# Patient Record
Sex: Male | Born: 1985 | Race: White | Marital: Single | Smoking: Former smoker
Health system: Southern US, Community
[De-identification: ages and names within clinical notes are randomized; demographics above are authoritative.]

## PROBLEM LIST (undated history)

## (undated) DIAGNOSIS — R531 Weakness: Secondary | ICD-10-CM

## (undated) DIAGNOSIS — R200 Anesthesia of skin: Secondary | ICD-10-CM

## (undated) DIAGNOSIS — G629 Polyneuropathy, unspecified: Secondary | ICD-10-CM

## (undated) DIAGNOSIS — R2689 Other abnormalities of gait and mobility: Secondary | ICD-10-CM

## (undated) DIAGNOSIS — R202 Paresthesia of skin: Secondary | ICD-10-CM

## (undated) DIAGNOSIS — I639 Cerebral infarction, unspecified: Secondary | ICD-10-CM

## (undated) DIAGNOSIS — E7800 Pure hypercholesterolemia, unspecified: Secondary | ICD-10-CM

## (undated) DIAGNOSIS — I10 Essential (primary) hypertension: Secondary | ICD-10-CM

## (undated) DIAGNOSIS — J45909 Unspecified asthma, uncomplicated: Secondary | ICD-10-CM

## (undated) DIAGNOSIS — IMO0001 Reserved for inherently not codable concepts without codable children: Secondary | ICD-10-CM

## (undated) DIAGNOSIS — R52 Pain, unspecified: Secondary | ICD-10-CM

## (undated) DIAGNOSIS — E119 Type 2 diabetes mellitus without complications: Secondary | ICD-10-CM

## (undated) DIAGNOSIS — M549 Dorsalgia, unspecified: Secondary | ICD-10-CM

## (undated) DIAGNOSIS — F329 Major depressive disorder, single episode, unspecified: Secondary | ICD-10-CM

## (undated) DIAGNOSIS — F32A Depression, unspecified: Secondary | ICD-10-CM

## (undated) DIAGNOSIS — F419 Anxiety disorder, unspecified: Secondary | ICD-10-CM

## (undated) DIAGNOSIS — R739 Hyperglycemia, unspecified: Secondary | ICD-10-CM

## (undated) DIAGNOSIS — G459 Transient cerebral ischemic attack, unspecified: Secondary | ICD-10-CM

## (undated) DIAGNOSIS — G4733 Obstructive sleep apnea (adult) (pediatric): Secondary | ICD-10-CM

## (undated) HISTORY — DX: Type 2 diabetes mellitus without complications: E11.9

## (undated) HISTORY — DX: Weakness: R53.1

## (undated) HISTORY — DX: Dorsalgia, unspecified: M54.9

## (undated) HISTORY — DX: Pain, unspecified: R52

## (undated) HISTORY — DX: Other abnormalities of gait and mobility: R26.89

## (undated) HISTORY — DX: Reserved for inherently not codable concepts without codable children: IMO0001

## (undated) HISTORY — DX: Paresthesia of skin: R20.2

## (undated) HISTORY — DX: Cerebral infarction, unspecified: I63.9

## (undated) HISTORY — DX: Pure hypercholesterolemia, unspecified: E78.00

## (undated) HISTORY — DX: Essential (primary) hypertension: I10

## (undated) HISTORY — DX: Anesthesia of skin: R20.0

## (undated) HISTORY — DX: Polyneuropathy, unspecified: G62.9

## (undated) HISTORY — DX: Unspecified asthma, uncomplicated: J45.909

## (undated) HISTORY — PX: TONSILLECTOMY: SUR1361

## (undated) HISTORY — DX: Hyperglycemia, unspecified: R73.9

## (undated) HISTORY — DX: Anxiety disorder, unspecified: F41.9

## (undated) HISTORY — PX: SPINE SURGERY: SHX786

## (undated) HISTORY — DX: Major depressive disorder, single episode, unspecified: F32.9

## (undated) HISTORY — DX: Transient cerebral ischemic attack, unspecified: G45.9

## (undated) HISTORY — DX: Depression, unspecified: F32.A

---
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
## 2010-08-15 ENCOUNTER — Telehealth (INDEPENDENT_AMBULATORY_CARE_PROVIDER_SITE_OTHER): Payer: Self-pay

## 2010-08-17 NOTE — Telephone Encounter (Signed)
Error

## 2010-12-30 ENCOUNTER — Ambulatory Visit (INDEPENDENT_AMBULATORY_CARE_PROVIDER_SITE_OTHER): Payer: BC Managed Care – PPO

## 2010-12-30 DIAGNOSIS — E669 Obesity, unspecified: Secondary | ICD-10-CM

## 2010-12-30 DIAGNOSIS — M545 Low back pain: Secondary | ICD-10-CM

## 2011-03-11 ENCOUNTER — Ambulatory Visit (INDEPENDENT_AMBULATORY_CARE_PROVIDER_SITE_OTHER): Payer: BC Managed Care – PPO | Admitting: Family Medicine

## 2011-03-11 ENCOUNTER — Telehealth: Payer: Self-pay | Admitting: Family Medicine

## 2011-03-11 DIAGNOSIS — E669 Obesity, unspecified: Secondary | ICD-10-CM

## 2011-03-11 DIAGNOSIS — J45909 Unspecified asthma, uncomplicated: Secondary | ICD-10-CM | POA: Insufficient documentation

## 2011-03-11 DIAGNOSIS — J209 Acute bronchitis, unspecified: Secondary | ICD-10-CM

## 2011-03-11 MED ORDER — PREDNISONE 20 MG PO TABS: ORAL_TABLET | ORAL | Status: DC

## 2011-03-11 MED ORDER — HYDROCODONE-HOMATROPINE 5-1.5 MG/5ML PO SYRP: 5.0000 mL | ORAL_SOLUTION | Freq: Four times a day (QID) | ORAL | Status: AC | PRN

## 2011-03-11 MED ORDER — MOXIFLOXACIN HCL 400 MG PO TABS: 400.0000 mg | ORAL_TABLET | Freq: Every day | ORAL | Status: AC

## 2011-03-11 NOTE — Telephone Encounter (Signed)
Found rx for Hydromet on printer, went to call in rx to [facility] since patient did not receive rx.  After calling in rx, was told by Amy that since MD couldn't find rx earlier, he wrote rx and handed to patient.  I called [facility] and told them to disregard rx called in.

## 2011-03-11 NOTE — Patient Instructions (Signed)
Asthma, Acute Bronchospasm     Your exam shows you have asthma, or acute bronchospasm that acts like asthma. Bronchospasm means your air passages become narrowed. These conditions are due to inflammation and airway spasm that cause narrowing of the bronchial tubes in the lungs. This causes you to have wheezing and shortness of breath.  CAUSES   Respiratory infections and allergies most often bring on these attacks. Smoking, air pollution, cold air, emotional upsets, and vigorous exercise can also bring them on.   TREATMENT   · Treatment is aimed at making the narrowed airways larger. Mild asthma/bronchospasm is usually controlled with inhaled medicines. Albuterol is a common medicine that you breathe in to open spastic or narrowed airways. Some trade names for albuterol are Ventolin or Proventil. Steroid medicine is also used to reduce the inflammation when an attack is moderate or severe. Antibiotics (medications used to kill germs) are only used if a bacterial infection is present.   · If you are pregnant and need to use Albuterol (Ventolin or Proventil), you can expect the baby to move more than usual shortly after the medicine is used.   HOME CARE INSTRUCTIONS   · Rest.   · Drink plenty of liquids. This helps the mucus to remain thin and easily coughed up. Do not use caffeine or alcohol.   · Do not smoke. Avoid being exposed to second-hand smoke.   · You play a critical role in keeping yourself in good health. Avoid exposure to things that cause you to wheeze. Avoid exposure to things that cause you to have breathing problems. Keep your medications up-to-date and available. Carefully follow your doctor's treatment plan.   · When pollen or pollution is bad, keep windows closed and use an air conditioner go to places with air conditioning. If you are allergic to furry pets or birds, find new homes for them or keep them outside.   · Take your medicine exactly as prescribed.   · Asthma requires careful medical  attention. See your caregiver for follow-up as advised. If you are more than [redacted] weeks pregnant and you were prescribed any new medications, let your Obstetrician know about the visit and how you are doing. Arrange a recheck.   SEEK IMMEDIATE MEDICAL CARE IF:   · You are getting worse.   · You have trouble breathing. If severe, call 911.   · You develop chest pain or discomfort.   · You are throwing up or not drinking fluids.   · You are not getting better within 24 hours.   · You are coughing up yellow, green, brown, or bloody sputum.   · You develop a fever over 102° F (38.9° C).   · You have trouble swallowing.   MAKE SURE YOU:   · Understand these instructions.   · Will watch your condition.   · Will get help right away if you are not doing well or get worse.   Document Released: 04/18/2006 Document Revised: 09/13/2010 Document Reviewed: 12/16/2006  ExitCare® Patient Information ©2012 [employer].

## 2011-03-11 NOTE — Progress Notes (Signed)
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  He is seen with mother.  He had been improving until this past week when he was exposed to coworkers who are sick and he got much worse with violent loud cough and gasping.  He has inhalers and nebulizers. Unable to work today He's light headed and dizzy, esp after cough  O:  Violent cough, appears tired. No retractions HEENT:  Red pharynx, normal TM's Coarse BS Heart:  Rate about 100, no murmur.  Assessment: Acute bronchitis  Plan: Hydromet, Levaquin, and prednisone  I work for 3 days

## 2011-03-14 ENCOUNTER — Ambulatory Visit (INDEPENDENT_AMBULATORY_CARE_PROVIDER_SITE_OTHER): Payer: BC Managed Care – PPO | Admitting: Family Medicine

## 2011-03-14 ENCOUNTER — Ambulatory Visit: Payer: BC Managed Care – PPO

## 2011-03-14 VITALS — BP 131/84 | HR 112 | Temp 98.2°F | Resp 18 | Ht 75.0 in | Wt >= 6400 oz

## 2011-03-14 DIAGNOSIS — R059 Cough, unspecified: Secondary | ICD-10-CM

## 2011-03-14 DIAGNOSIS — R05 Cough: Secondary | ICD-10-CM

## 2011-03-14 DIAGNOSIS — J40 Bronchitis, not specified as acute or chronic: Secondary | ICD-10-CM

## 2011-03-14 DIAGNOSIS — M794 Hypertrophy of (infrapatellar) fat pad: Secondary | ICD-10-CM

## 2011-03-14 DIAGNOSIS — J45909 Unspecified asthma, uncomplicated: Secondary | ICD-10-CM

## 2011-03-14 LAB — POCT CBC
Granulocyte percent: 84.8 %G — AB (ref 37–80)
HCT, POC: 44 % (ref 43.5–53.7)
Hemoglobin: 14.2 g/dL (ref 14.1–18.1)
Lymph, poc: 1.4 (ref 0.6–3.4)
MCH, POC: 26.9 pg — AB (ref 27–31.2)
MCHC: 32.3 g/dL (ref 31.8–35.4)
MCV: 83.5 fL (ref 80–97)
MID (cbc): 0.4 (ref 0–0.9)
MPV: 9.5 fL (ref 0–99.8)
POC Granulocyte: 9.6 — AB (ref 2–6.9)
POC LYMPH PERCENT: 12.1 %L (ref 10–50)
POC MID %: 3.1 %M (ref 0–12)
Platelet Count, POC: 283 10*3/uL (ref 142–424)
RBC: 5.27 M/uL (ref 4.69–6.13)
RDW, POC: 14.7 %
WBC: 11.3 10*3/uL — AB (ref 4.6–10.2)

## 2011-03-14 MED ORDER — ESOMEPRAZOLE MAGNESIUM 40 MG PO CPDR: 40.0000 mg | DELAYED_RELEASE_CAPSULE | Freq: Every day | ORAL | Status: DC

## 2011-03-14 MED ORDER — MOMETASONE FURO-FORMOTEROL FUM 200-5 MCG/ACT IN AERO: 2.0000 | INHALATION_SPRAY | Freq: Two times a day (BID) | RESPIRATORY_TRACT | Status: DC

## 2011-03-14 NOTE — Progress Notes (Signed)
This is 26 year old man with asthma who comes in with his mother. He's had a violent cough for about a week now which has not improved since his visit here on Sunday when he got steroids, inhaler, Levaquin. He describes his cough is spasmodic and violent. He's had some retching with it. 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  Objective: Patient is an obese adult male in no acute distress who appears disaffected.  HEENT: Unremarkable  Chest: Intermittent violent cough, extra wheezes occasionally  Heart: Regular, no murmur or gallop  Skin: Good color, warm and dry  [facility] reading (PRIMARY) by  Dr. Milus Glazier:  nad on cxr  Results for orders placed in visit on 03/14/11  POCT CBC      Component Value Range   WBC 11.3 (*) 4.6 - 10.2 (K/uL)   Lymph, poc 1.4  0.6 - 3.4    POC LYMPH PERCENT 12.1  10 - 50 (%L)   MID (cbc) 0.4  0 - 0.9    POC MID % 3.1  0 - 12 (%M)   POC Granulocyte 9.6 (*) 2 - 6.9    Granulocyte percent 84.8 (*) 37 - 80 (%G)   RBC 5.27  4.69 - 6.13 (M/uL)   Hemoglobin 14.2  14.1 - 18.1 (g/dL)   HCT, POC 78.2  95.6 - 53.7 (%)   MCV 83.5  80 - 97 (fL)   MCH, POC 26.9 (*) 27 - 31.2 (pg)   MCHC 32.3  31.8 - 35.4 (g/dL)   RDW, POC 21.3     Platelet Count, POC 283  142 - 424 (K/uL)   MPV 9.5  0 - 99.8 (fL)    A:  Severe bronchitis, unresponsive to current meds.  Also, patient has unusual affect and it's difficult to relate to him, especially with mother in room interrupting frequently and speaking for him.  His morbid obesity makes me wonder if he has some element of reflux or sleep apnea.  P:  Add Dulera and Nexium Follow up in 1 week

## 2011-03-14 NOTE — Patient Instructions (Signed)
Follow up 1 week.

## 2011-03-18 LAB — BORDETELLA PERTUSSIS ANTIBODY
B pertussis IgA Ab, Quant: 0.2 U/mL (ref <=1.1)
B pertussis IgG Ab, Quant: 2.4 U/mL (ref <=2.4)
B pertussis IgM Ab, Quant: 0.6 U/mL (ref <=1.1)

## 2011-03-19 ENCOUNTER — Telehealth: Payer: Self-pay

## 2011-03-19 NOTE — Telephone Encounter (Signed)
.  [facility] DELORUS STATES WE HAD CALLED REGARDING HER SON AND SHE IS RETURNING THE CALL PLEASE CALL [phone]

## 2011-03-20 NOTE — Telephone Encounter (Signed)
PT MOTHER ALREADY ADVISED OF LAB RESULTS THIS AM

## 2011-07-20 ENCOUNTER — Ambulatory Visit (INDEPENDENT_AMBULATORY_CARE_PROVIDER_SITE_OTHER): Payer: BC Managed Care – PPO | Admitting: Internal Medicine

## 2011-07-20 ENCOUNTER — Telehealth: Payer: Self-pay

## 2011-07-20 VITALS — BP 137/82 | HR 136 | Temp 99.1°F | Resp 17 | Ht 75.0 in | Wt >= 6400 oz

## 2011-07-20 DIAGNOSIS — Z6841 Body Mass Index (BMI) 40.0 and over, adult: Secondary | ICD-10-CM | POA: Insufficient documentation

## 2011-07-20 DIAGNOSIS — J45909 Unspecified asthma, uncomplicated: Secondary | ICD-10-CM

## 2011-07-20 DIAGNOSIS — J9801 Acute bronchospasm: Secondary | ICD-10-CM

## 2011-07-20 MED ORDER — HYDROCODONE-HOMATROPINE 5-1.5 MG/5ML PO SYRP: 5.0000 mL | ORAL_SOLUTION | Freq: Three times a day (TID) | ORAL | Status: AC | PRN

## 2011-07-20 MED ORDER — ALBUTEROL SULFATE HFA 108 (90 BASE) MCG/ACT IN AERS: 2.0000 | INHALATION_SPRAY | Freq: Four times a day (QID) | RESPIRATORY_TRACT | Status: AC | PRN

## 2011-07-20 MED ORDER — PREDNISONE 20 MG PO TABS: ORAL_TABLET | ORAL | Status: DC

## 2011-07-20 MED ORDER — ALBUTEROL SULFATE (2.5 MG/3ML) 0.083% IN NEBU: 2.5000 mg | INHALATION_SOLUTION | Freq: Four times a day (QID) | RESPIRATORY_TRACT | Status: AC | PRN

## 2011-07-20 MED ORDER — BECLOMETHASONE DIPROPIONATE 80 MCG/ACT IN AERS: 1.0000 | INHALATION_SPRAY | RESPIRATORY_TRACT | Status: DC | PRN

## 2011-07-20 NOTE — Telephone Encounter (Signed)
Pts mother Gracelyn Nurse states that the pharmacist at [facility] disagrees with the dosage that was prescribed for the qvar that was prescribed. Pt is at the pharmacy now waiting. Pt mother would like [place] to call [facility] [address] asap. [phone] Gracelyn Nurse)

## 2011-07-20 NOTE — Telephone Encounter (Signed)
Spoke with Dr. Merla Riches and he wanted the RX to read 1 puff 2 times daily. Pharmacy was called and this was corrected.

## 2011-07-20 NOTE — Progress Notes (Signed)
  Subjective:    Patient ID: Brandon Morales, male    DOB: 08/17/1985, 26 y.o.   MRN: [id number]  HPI 1. BMI 50.0-59.9, adult   2. Asthma    Out of medication and recently having problems due to significant wet weather Has done well since last treatment here until recently Asthma dates to childhood Cough keeps him awake Wheezing throughout the day for one week    Review of SystemsNo fever chills or night sweats Cough nonproductive     Objective:   Physical Exam Morbid obesity Mild wheezing on forced expiration bilaterally       Assessment & Plan:   1. BMI 50.0-59.9, adult   2. Asthma    Meds ordered this encounter  Medications  . beclomethasone (QVAR) 80 MCG/ACT inhaler    Sig: Inhale 1 puff into the lungs Twice a day    Dispense:  1 Inhaler    Refill:  11  . albuterol (PROVENTIL) (2.5 MG/3ML) 0.083% nebulizer solution    Sig: Take 3 mLs (2.5 mg total) by nebulization every 6 (six) hours as needed.    Dispense:  75 mL    Refill:  11  . albuterol (PROVENTIL HFA;VENTOLIN HFA) 108 (90 BASE) MCG/ACT inhaler    Sig: Inhale 2 puffs into the lungs every 6 (six) hours as needed.    Dispense:  1 Inhaler    Refill:  11  . predniSONE (DELTASONE) 20 MG tablet    Sig: 3/3/2/2/1/1single daily dose for 6 days    Dispense:  12 tablet    Refill:  0  . HYDROcodone-homatropine (HYCODAN) 5-1.5 MG/5ML syrup    Sig: Take 5 mLs by mouth every 8 (eight) hours as needed for cough.    Dispense:  120 mL    Refill:  0   Followup one month

## 2011-08-04 ENCOUNTER — Ambulatory Visit (INDEPENDENT_AMBULATORY_CARE_PROVIDER_SITE_OTHER): Payer: BC Managed Care – PPO | Admitting: Internal Medicine

## 2011-08-04 ENCOUNTER — Ambulatory Visit: Payer: BC Managed Care – PPO

## 2011-08-04 VITALS — BP 118/74 | HR 112 | Temp 99.1°F | Resp 18 | Ht 74.5 in | Wt >= 6400 oz

## 2011-08-04 DIAGNOSIS — E669 Obesity, unspecified: Secondary | ICD-10-CM

## 2011-08-04 DIAGNOSIS — J45909 Unspecified asthma, uncomplicated: Secondary | ICD-10-CM

## 2011-08-04 DIAGNOSIS — R5383 Other fatigue: Secondary | ICD-10-CM

## 2011-08-04 DIAGNOSIS — R0602 Shortness of breath: Secondary | ICD-10-CM

## 2011-08-04 DIAGNOSIS — J45991 Cough variant asthma: Secondary | ICD-10-CM

## 2011-08-04 DIAGNOSIS — J40 Bronchitis, not specified as acute or chronic: Secondary | ICD-10-CM

## 2011-08-04 DIAGNOSIS — R5381 Other malaise: Secondary | ICD-10-CM

## 2011-08-04 DIAGNOSIS — R05 Cough: Secondary | ICD-10-CM

## 2011-08-04 LAB — POCT CBC
HCT, POC: 44.2 % (ref 43.5–53.7)
Hemoglobin: 13.4 g/dL — AB (ref 14.1–18.1)
Lymph, poc: 2.3 (ref 0.6–3.4)
MCH, POC: 26.3 pg — AB (ref 27–31.2)
MCHC: 30.3 g/dL — AB (ref 31.8–35.4)
MCV: 86.6 fL (ref 80–97)
RBC: 5.1 M/uL (ref 4.69–6.13)
WBC: 11 10*3/uL — AB (ref 4.6–10.2)

## 2011-08-04 LAB — GLUCOSE, POCT (MANUAL RESULT ENTRY): POC Glucose: 104 mg/dL — AB (ref 70–99)

## 2011-08-04 MED ORDER — AMOXICILLIN 500 MG PO CAPS: 1000.0000 mg | ORAL_CAPSULE | Freq: Two times a day (BID) | ORAL | Status: AC

## 2011-08-04 MED ORDER — IPRATROPIUM BROMIDE 0.02 % IN SOLN
0.5000 mg | Freq: Once | RESPIRATORY_TRACT | Status: AC
  Administered 2011-08-04: 0.5 mg via RESPIRATORY_TRACT

## 2011-08-04 MED ORDER — HYDROCOD POLST-CHLORPHEN POLST 10-8 MG/5ML PO LQCR: 5.0000 mL | Freq: Two times a day (BID) | ORAL | Status: DC | PRN

## 2011-08-04 MED ORDER — ALBUTEROL SULFATE (2.5 MG/3ML) 0.083% IN NEBU
2.5000 mg | INHALATION_SOLUTION | Freq: Once | RESPIRATORY_TRACT | Status: AC
  Administered 2011-08-04: 2.5 mg via RESPIRATORY_TRACT

## 2011-08-04 NOTE — Progress Notes (Signed)
  Subjective:    Patient ID: Brandon Morales, male    DOB: 22-May-1985, 26 y.o.   MRN: [id number]  HPI Has asthma, see last visit 7/5 where restarted meds. C/o of persistant cough over 1 mo. No sputum, no fever or nite sweats. 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 Hx neg TB screen 7mos ago Used neb this am. Very obese, no hx of DM   Review of Systems     Objective:   Physical Exam 99% ox, PFR 440,nl640 HEENT neg, lungs scattered wheezes, heart nl Neb now Results for orders placed in visit on 08/04/11  POCT CBC      Component Value Range   WBC 11.0 (*) 4.6 - 10.2 K/uL   Lymph, poc 2.3  0.6 - 3.4   POC LYMPH PERCENT 21.1  10 - 50 %L   MID (cbc) 0.7  0 - 0.9   POC MID % 6.0  0 - 12 %M   POC Granulocyte 8.0 (*) 2 - 6.9   Granulocyte percent 72.9  37 - 80 %G   RBC 5.10  4.69 - 6.13 M/uL   Hemoglobin 13.4 (*) 14.1 - 18.1 g/dL   HCT, POC 40.9  81.1 - 53.7 %   MCV 86.6  80 - 97 fL   MCH, POC 26.3 (*) 27 - 31.2 pg   MCHC 30.3 (*) 31.8 - 35.4 g/dL   RDW, POC 91.4     Platelet Count, POC 255  142 - 424 K/uL   MPV 9.3  0 - 99.8 fL  GLUCOSE, POCT (MANUAL RESULT ENTRY)      Component Value Range   POC Glucose 104 (*) 70 - 99 mg/dl  POCT GLYCOSYLATED HEMOGLOBIN (HGB A1C)      Component Value Range   Hemoglobin A1C 5.3     Post PFR 500l/m    [facility] reading (PRIMARY) by  Dr.Guest cxr no infiltrate, no tb.   Assessment & Plan:  Take meds as ordered Schedule cpe with Dr. Merla Riches

## 2011-12-21 ENCOUNTER — Ambulatory Visit: Payer: BC Managed Care – PPO | Admitting: Physician Assistant

## 2011-12-21 VITALS — BP 118/90 | HR 83 | Temp 97.1°F | Resp 16 | Ht 75.0 in

## 2011-12-21 DIAGNOSIS — Z23 Encounter for immunization: Secondary | ICD-10-CM

## 2011-12-21 NOTE — Progress Notes (Signed)
Pt here for flu shot only

## 2012-01-21 ENCOUNTER — Ambulatory Visit (INDEPENDENT_AMBULATORY_CARE_PROVIDER_SITE_OTHER): Payer: BC Managed Care – PPO | Admitting: Family Medicine

## 2012-01-21 ENCOUNTER — Ambulatory Visit: Payer: BC Managed Care – PPO

## 2012-01-21 VITALS — BP 137/91 | HR 95 | Temp 97.6°F | Resp 16

## 2012-01-21 DIAGNOSIS — E669 Obesity, unspecified: Secondary | ICD-10-CM

## 2012-01-21 DIAGNOSIS — M25569 Pain in unspecified knee: Secondary | ICD-10-CM

## 2012-01-21 DIAGNOSIS — T148XXA Other injury of unspecified body region, initial encounter: Secondary | ICD-10-CM

## 2012-01-21 MED ORDER — IBUPROFEN 800 MG PO TABS: 800.0000 mg | ORAL_TABLET | Freq: Three times a day (TID) | ORAL | Status: DC | PRN

## 2012-01-21 NOTE — Progress Notes (Signed)
[facility]:  Office Visit  Chief Complaint:  Chief Complaint  Patient presents with  . Knee Pain    left knee thigh and left buttock pain after trying to get from a kneeling position to a standing position Saturday    HPI: Brandon Morales is a 27 y.o. male who complains of  Left knee pain x 3 days, started after going from crouched position where he is on his right knee and his left knee is in a 90 degree flexed position and goes to standing position. Throbbing, sharp, aching pain, worse with standing 10/10 but better with sitting 2/10 pain. Had prior left knee injury 8 years ago s/p fall. 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 OTC treatment without relief. Denies numbness, weakness, tingling.   Past Medical History  Diagnosis Date  . Asthma   . Anxiety    No past surgical history on file. History   Social History  . Marital Status: Single    Spouse Name: N/A    Number of Children: N/A  . Years of Education: N/A   Social History Main Topics  . Smoking status: Former Smoker    Quit date: 03/10/2008  . Smokeless tobacco: Not on file  . Alcohol Use: Not on file  . Drug Use: Not on file  . Sexually Active: Not on file   Other Topics Concern  . Not on file   Social History Narrative  . No narrative on file   Family History  Problem Relation Age of Onset  . Diabetes Mother   . Hypertension Mother   . Diabetes Maternal Grandmother    Allergies  Allergen Reactions  . Erythromycin   . Zithromax (Azithromycin Dihydrate)    Prior to Admission medications   Medication Sig Start Date End Date Taking? Authorizing Provider  albuterol (PROVENTIL HFA;VENTOLIN HFA) 108 (90 BASE) MCG/ACT inhaler Inhale 2 puffs into the lungs every 6 (six) hours as needed. 07/20/11  Yes Tonye Pearson, MD  albuterol (PROVENTIL) (2.5 MG/3ML) 0.083% nebulizer solution Take 3 mLs (2.5 mg  total) by nebulization every 6 (six) hours as needed. 07/20/11  Yes Tonye Pearson, MD  aspirin 500 MG EC tablet Take 500 mg by mouth every 6 (six) hours as needed.   Yes Historical Provider, MD  beclomethasone (QVAR) 80 MCG/ACT inhaler Inhale 1 puff into the lungs as needed. 07/20/11  Yes Tonye Pearson, MD  cholecalciferol (VITAMIN D) 400 UNITS TABS Take by mouth.   Yes Historical Provider, MD  esomeprazole (NEXIUM) 40 MG capsule Take 1 capsule (40 mg total) by mouth daily. 03/14/11 03/13/12 Yes Elvina Sidle, MD  zinc gluconate 50 MG tablet Take 50 mg by mouth daily.   Yes Historical Provider, MD  benzonatate (TESSALON) 100 MG capsule Take 100 mg by mouth 3 (three) times daily as needed.    Historical Provider, MD  chlorpheniramine-HYDROcodone (TUSSIONEX PENNKINETIC ER) 10-8 MG/5ML LQCR Take 5 mLs by mouth every 12 (twelve) hours as needed. 08/04/11   Jonita Albee, MD  HYDROcodone-homatropine [facility]) 5-1.5 MG/5ML syrup Take by mouth every 6 (six) hours as needed.    Historical Provider, MD  Mometasone Furo-Formoterol Fum 200-5 MCG/ACT AERO Inhale 2 puffs into the lungs 2 (two) times daily. 03/14/11   Elvina Sidle, MD  predniSONE (DELTASONE) 20 MG tablet 3/3/2/2/1/1single daily dose for 6 days 07/20/11   Tonye Pearson,  MD     ROS: The patient denies fevers, chills, night sweats, unintentional weight loss, chest pain, palpitations, wheezing, dyspnea on exertion, nausea, vomiting, abdominal pain, dysuria, hematuria, melena, numbness, weakness, or tingling.   All other systems have been reviewed and were otherwise negative with the exception of those mentioned in the HPI and as above.    PHYSICAL EXAM: Filed Vitals:   01/21/12 0833  BP: 137/91  Pulse: 95  Temp: 97.6 F (36.4 C)  Resp: 16   There were no vitals filed for this visit. There is no height or weight on file to calculate BMI.  General: Alert, no acute distress. Morbidly obese male HEENT:  Normocephalic, atraumatic,  oropharynx patent.  Cardiovascular:  Regular rate and rhythm, no rubs murmurs or gallops.  No Carotid bruits, radial pulse intact. No pedal edema.  Respiratory: Clear to auscultation bilaterally.  No wheezes, rales, or rhonchi.  No cyanosis, no use of accessory musculature GI: No organomegaly, abdomen is soft and non-tender, positive bowel sounds.  No masses. Skin: No rashes. Neurologic: Facial musculature symmetric. Psychiatric: Patient is appropriate throughout our interaction. Lymphatic: No cervical lymphadenopathy Musculoskeletal: Gait patient in wheelchair Left hip-able to internallyand externally rotate Left knee-No deformities,Full ROM, 5/5 strength,sensation intact. + tender medial and lateral joint line, medial greater than later. Also pain along LCL on valgus/varus stresss Neg Lachman's however very difficult to tell based on patient's weight   LABS:    EKG/XRAY:   Primary read interpreted by Dr. Conley Rolls at [facility]. No fractures/dilsocation + soft tissue swelling   ASSESSMENT/PLAN: Encounter Diagnoses  Name Primary?  . Knee pain Yes  . Obesity (BMI 35.0-39.9 without comorbidity)   . Sprain and strain    Crutches to help mobilize around house Rx Ibuprofen 800 mg every 8 hrs prn with food Increase activities and weightbearing status as tolerated.  Patient has knee brace at home, ROM prn, RICE F/u in 7 days, patient will get work note to be off work for 7 days since his job requires him to be standing Would like referral to [facility] for Brandon Morales, Brandon Mascio PHUONG, DO 01/21/2012 10:24 AM

## 2012-10-17 ENCOUNTER — Ambulatory Visit (INDEPENDENT_AMBULATORY_CARE_PROVIDER_SITE_OTHER): Payer: BC Managed Care – PPO | Admitting: Family Medicine

## 2012-10-17 VITALS — BP 140/80 | HR 110 | Temp 90.0°F | Resp 18 | Ht 75.0 in | Wt >= 6400 oz

## 2012-10-17 DIAGNOSIS — K1379 Other lesions of oral mucosa: Secondary | ICD-10-CM

## 2012-10-17 DIAGNOSIS — R5381 Other malaise: Secondary | ICD-10-CM

## 2012-10-17 DIAGNOSIS — E319 Polyglandular dysfunction, unspecified: Secondary | ICD-10-CM

## 2012-10-17 DIAGNOSIS — G479 Sleep disorder, unspecified: Secondary | ICD-10-CM

## 2012-10-17 MED ORDER — METHYLPREDNISOLONE SODIUM SUCC 125 MG IJ SOLR
125.0000 mg | Freq: Once | INTRAMUSCULAR | Status: AC
  Administered 2012-10-17: 125 mg via INTRAMUSCULAR

## 2012-10-17 MED ORDER — AMOXICILLIN 875 MG PO TABS: 875.0000 mg | ORAL_TABLET | Freq: Two times a day (BID) | ORAL | Status: DC

## 2012-10-17 MED ORDER — RANITIDINE HCL 150 MG PO TABS: 150.0000 mg | ORAL_TABLET | Freq: Two times a day (BID) | ORAL | Status: DC

## 2012-10-17 MED ORDER — DIPHENHYDRAMINE HCL 50 MG/ML IJ SOLN
50.0000 mg | Freq: Once | INTRAMUSCULAR | Status: AC
  Administered 2012-10-17: 50 mg via INTRAMUSCULAR

## 2012-10-17 MED ORDER — PREDNISONE 20 MG PO TABS: ORAL_TABLET | ORAL | Status: DC

## 2012-10-17 NOTE — Progress Notes (Signed)
Subjective: 27 year old male. 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 He awakened around 9 with a feeling like he couldn't breathe and was choking. He vomited several times. He felt short of breath and very anxious. He has not had quite this sensation before ever. Otherwise brief review of systems was normal. He has apparently generally been fairly healthy.  He does have a history of asthma. He has not been tested for sleep apnea. He does have daytime fatigue which he attributes to his job. He does go to sleep fairly easily also contributed to his job. He stays with his mother. He does snore. I spoke with his mother and she is concerned about these things also.  Objective: Morbidly obese white male. Alert and oriented but speaks in a very soft voice. His throat has swelling of the uvula and some erythema of the it, probably 2 or 3 times normal size but no obstruction of airway. He has ample airway around it. His neck supple without significant nodes. Chest clear. Heart regular but tachycardic.  EKG normal but mildly tachycardic  Neck 22-1/2 inches in circumference  Assessment: Uvula edema Suspicious for sleep apnea syndrome Fatigue Heart tachycardia  Plan: Ranitidine, Benadryl, Solu-Medrol. Orders sleep study  Amoxicillin  Return tomorrow for recheck of throat  Strep test  Results for orders placed in visit on 10/17/12  POCT RAPID STREP A (OFFICE)      Result Value Range   Rapid Strep A Screen Negative  Negative   Over the course of a hour and a half or so he was checked several times. His

## 2012-10-17 NOTE — Patient Instructions (Addendum)
Take prednisone 3 pills daily for 2 days, 2 daily for 2 days, one daily for 2 days. Since we gave you the injection today weight and begin them with food at 6 PM tonight, then each morning.  Amoxicillin 875 mg one pill twice daily.  Zyrtec antihistamine one pill once daily  Ranitidine 150 mg twice daily  In the event of worse swelling sensation or difficulty breathing call 911 and go to the hospital.  Sleep apnea testing will be scheduled for him.  Return tomorrow morning for a followup check

## 2012-10-18 ENCOUNTER — Ambulatory Visit (INDEPENDENT_AMBULATORY_CARE_PROVIDER_SITE_OTHER): Payer: BC Managed Care – PPO | Admitting: Family Medicine

## 2012-10-18 VITALS — BP 137/84 | HR 71 | Temp 98.1°F | Resp 18 | Wt >= 6400 oz

## 2012-10-18 DIAGNOSIS — K1370 Unspecified lesions of oral mucosa: Secondary | ICD-10-CM

## 2012-10-18 DIAGNOSIS — K1379 Other lesions of oral mucosa: Secondary | ICD-10-CM

## 2012-10-18 NOTE — Progress Notes (Signed)
Subjective: 27 year old man who is here yesterday with uvula edema. After he went home he slept yesterday he had last night. He still feels tired today, but is not having a choking sensation.  Objective: The uvula is basically back to normal in size, about a fourth or fifth of its size from yesterday.  Assessment: Uvula edema, uncertain etiology  Plan: Still will pursue the sleep study testing He can go back to work If he feels tickling in his throat he can start taking a daily antihistamine Return if worse or call 911 in an emergency at anytime

## 2012-10-18 NOTE — Patient Instructions (Signed)
Take an antihistamine if symptoms persist, such as Claritin, Allegra, or Zyrtec  Drink plenty of fluids  Return or call 911 in the event of worsening  Return to work this evening

## 2013-08-13 ENCOUNTER — Ambulatory Visit (INDEPENDENT_AMBULATORY_CARE_PROVIDER_SITE_OTHER): Payer: BC Managed Care – PPO | Admitting: Family Medicine

## 2013-08-13 VITALS — BP 122/86 | HR 79 | Temp 98.1°F | Resp 18 | Ht 75.0 in | Wt >= 6400 oz

## 2013-08-13 DIAGNOSIS — B001 Herpesviral vesicular dermatitis: Secondary | ICD-10-CM

## 2013-08-13 DIAGNOSIS — R509 Fever, unspecified: Secondary | ICD-10-CM

## 2013-08-13 DIAGNOSIS — B009 Herpesviral infection, unspecified: Secondary | ICD-10-CM

## 2013-08-13 DIAGNOSIS — IMO0001 Reserved for inherently not codable concepts without codable children: Secondary | ICD-10-CM

## 2013-08-13 DIAGNOSIS — R51 Headache: Secondary | ICD-10-CM

## 2013-08-13 DIAGNOSIS — J4541 Moderate persistent asthma with (acute) exacerbation: Secondary | ICD-10-CM

## 2013-08-13 DIAGNOSIS — R42 Dizziness and giddiness: Secondary | ICD-10-CM

## 2013-08-13 DIAGNOSIS — J45901 Unspecified asthma with (acute) exacerbation: Secondary | ICD-10-CM

## 2013-08-13 DIAGNOSIS — E860 Dehydration: Secondary | ICD-10-CM

## 2013-08-13 LAB — COMPREHENSIVE METABOLIC PANEL
ALBUMIN: 3.9 g/dL (ref 3.5–5.2)
ALT: 38 U/L (ref 0–53)
AST: 17 U/L (ref 0–37)
Alkaline Phosphatase: 50 U/L (ref 39–117)
BUN: 18 mg/dL (ref 6–23)
CALCIUM: 9 mg/dL (ref 8.4–10.5)
CHLORIDE: 104 meq/L (ref 96–112)
CO2: 24 mEq/L (ref 19–32)
Creat: 0.89 mg/dL (ref 0.50–1.35)
GLUCOSE: 105 mg/dL — AB (ref 70–99)
POTASSIUM: 4.1 meq/L (ref 3.5–5.3)
SODIUM: 137 meq/L (ref 135–145)
TOTAL PROTEIN: 7.3 g/dL (ref 6.0–8.3)
Total Bilirubin: 0.7 mg/dL (ref 0.2–1.2)

## 2013-08-13 LAB — POCT CBC
GRANULOCYTE PERCENT: 71.4 % (ref 37–80)
HCT, POC: 44.5 % (ref 43.5–53.7)
Hemoglobin: 14.8 g/dL (ref 14.1–18.1)
Lymph, poc: 2.5 (ref 0.6–3.4)
MCH, POC: 27.7 pg (ref 27–31.2)
MCHC: 33.3 g/dL (ref 31.8–35.4)
MCV: 83.4 fL (ref 80–97)
MID (CBC): 0.2 (ref 0–0.9)
MPV: 8.3 fL (ref 0–99.8)
PLATELET COUNT, POC: 219 10*3/uL (ref 142–424)
POC GRANULOCYTE: 6.9 (ref 2–6.9)
POC LYMPH %: 26.2 % (ref 10–50)
POC MID %: 2.4 %M (ref 0–12)
RBC: 5.33 M/uL (ref 4.69–6.13)
RDW, POC: 14.8 %
WBC: 9.6 10*3/uL (ref 4.6–10.2)

## 2013-08-13 LAB — POCT UA - MICROSCOPIC ONLY
Bacteria, U Microscopic: NEGATIVE
CRYSTALS, UR, HPF, POC: NEGATIVE
Casts, Ur, LPF, POC: NEGATIVE
MUCUS UA: NEGATIVE
YEAST UA: NEGATIVE

## 2013-08-13 LAB — POCT SEDIMENTATION RATE: POCT SED RATE: 51 mm/hr — AB (ref 0–22)

## 2013-08-13 LAB — POCT URINALYSIS DIPSTICK
Bilirubin, UA: NEGATIVE
Glucose, UA: NEGATIVE
KETONES UA: NEGATIVE
Leukocytes, UA: NEGATIVE
Nitrite, UA: NEGATIVE
PH UA: 5.5
PROTEIN UA: NEGATIVE
RBC UA: NEGATIVE
SPEC GRAV UA: 1.015
UROBILINOGEN UA: 0.2

## 2013-08-13 LAB — HIV ANTIBODY (ROUTINE TESTING W REFLEX): HIV 1&2 Ab, 4th Generation: NONREACTIVE

## 2013-08-13 MED ORDER — VALACYCLOVIR HCL 1 G PO TABS: 2000.0000 mg | ORAL_TABLET | Freq: Two times a day (BID) | ORAL | Status: DC

## 2013-08-13 NOTE — Patient Instructions (Addendum)
Dehydration, Adult Dehydration is when you lose more fluids from the body than you take in. Vital organs like the kidneys, brain, and heart cannot function without a proper amount of fluids and salt. Any loss of fluids from the body can cause dehydration.  CAUSES   Vomiting.  Diarrhea.  Excessive sweating.  Excessive urine output.  Fever. SYMPTOMS  Mild dehydration  Thirst.  Dry lips.  Slightly dry mouth. Moderate dehydration  Very dry mouth.  Sunken eyes.  Skin does not bounce back quickly when lightly pinched and released.  Dark urine and decreased urine production.  Decreased tear production.  Headache. Severe dehydration  Very dry mouth.  Extreme thirst.  Rapid, weak pulse (more than 100 beats per minute at rest).  Cold hands and feet.  Not able to sweat in spite of heat and temperature.  Rapid breathing.  Blue lips.  Confusion and lethargy.  Difficulty being awakened.  Minimal urine production.  No tears. DIAGNOSIS  Your caregiver will diagnose dehydration based on your symptoms and your exam. Blood and urine tests will help confirm the diagnosis. The diagnostic evaluation should also identify the cause of dehydration. TREATMENT  Treatment of mild or moderate dehydration can often be done at home by increasing the amount of fluids that you drink. It is best to drink small amounts of fluid more often. Drinking too much at one time can make vomiting worse. Refer to the home care instructions below. Severe dehydration needs to be treated at the hospital where you will probably be given intravenous (IV) fluids that contain water and electrolytes. HOME CARE INSTRUCTIONS   Ask your caregiver about specific rehydration instructions.  Drink enough fluids to keep your urine clear or pale yellow.  Drink small amounts frequently if you have nausea and vomiting.  Eat as you normally do.  Avoid:  Foods or drinks high in sugar.  Carbonated  drinks.  Juice.  Extremely hot or cold fluids.  Drinks with caffeine.  Fatty, greasy foods.  Alcohol.  Tobacco.  Overeating.  Gelatin desserts.  Wash your hands well to avoid spreading bacteria and viruses.  Only take over-the-counter or prescription medicines for pain, discomfort, or fever as directed by your caregiver.  Ask your caregiver if you should continue all prescribed and over-the-counter medicines.  Keep all follow-up appointments with your caregiver. SEEK MEDICAL CARE IF:  You have abdominal pain and it increases or stays in one area (localizes).  You have a rash, stiff neck, or severe headache.  You are irritable, sleepy, or difficult to awaken.  You are weak, dizzy, or extremely thirsty. SEEK IMMEDIATE MEDICAL CARE IF:   You are unable to keep fluids down or you get worse despite treatment.  You have frequent episodes of vomiting or diarrhea.  You have blood or green matter (bile) in your vomit.  You have blood in your stool or your stool looks black and tarry.  You have not urinated in 6 to 8 hours, or you have only urinated a small amount of very dark urine.  You have a fever.  You faint. MAKE SURE YOU:   Understand these instructions.  Will watch your condition.  Will get help right away if you are not doing well or get worse. Document Released: 01/01/2005 Document Revised: 03/26/2011 Document Reviewed: 08/21/2010 ExitCare Patient Information 2015 [employer]. This information is not intended to replace advice given to you by your health care provider. Make sure you discuss any questions you have with your health care   provider.  

## 2013-08-13 NOTE — Progress Notes (Signed)
Subjective:  This chart was scribed for Norberto Sorenson, MD by Carl Best, Medical Scribe. This patient was seen in Room 14 and the patient's care was started at 9:36 AM.   Patient ID: Brandon Morales, male    DOB: 22-Dec-1985, 28 y.o.   MRN: [id number] Chief Complaint  Patient presents with  . Mouth Lesions    1 week  . Headache  . Generalized Body Aches  . Nausea     HPI HPI Comments: Brandon Morales is a 28 y.o. male who presents to the [facility] complaining of constant dizziness and back pain that started nine days ago.  The patient states that his symptoms started with severe chapped lips.  He states that one of his cold sores opened up and infected his bottom lip.  He also experienced abdominal pain, vomiting, and diarrhea that started before his current symptoms.  He is no longer vomiting but will still experience intermittent diarrhea.  He states that he feels like the room is spinning when he gets dizzy and will experience relief to his symptoms when he closes his eyes or sleeps.  He states that he has not been able to drink normally for a week but will occasionally sip on spring water.  He ate burgers last night.  He endorses subjective intermittent fever, diaphoresis, myalgias, neck pain, and chills as associated symptoms.  He has been complaining of a severe headache that started at the front his head and extending to the back.  He denies severe sore throat, genital sores, abnormal urine, rhinorrhea and sinus pressure as associated symptoms.  He last urinated and had a bowel movement this morning and states that both looked normal.  He endorses sick contacts.  He states that his symptoms are so severe that he is unable to drive.  He has a history of tonsillectomy.  He works in a prison.    Past Medical History  Diagnosis Date  . Asthma   . Anxiety    History reviewed. No pertinent past surgical history. Family History  Problem Relation Age of Onset  .  Diabetes Mother   . Hypertension Mother   . Diabetes Maternal Grandmother    History   Social History  . Marital Status: Single    Spouse Name: N/A    Number of Children: N/A  . Years of Education: N/A   Occupational History  . Not on file.   Social History Main Topics  . Smoking status: Former Smoker    Quit date: 03/10/2008  . Smokeless tobacco: Not on file  . Alcohol Use: Not on file  . Drug Use: Not on file  . Sexual Activity: Not on file   Other Topics Concern  . Not on file   Social History Narrative  . No narrative on file   Allergies  Allergen Reactions  . Erythromycin   . Zithromax [Azithromycin Dihydrate]     Review of Systems  Constitutional: Positive for fever, chills, diaphoresis and appetite change.  HENT: Positive for mouth sores. Negative for ear pain, rhinorrhea and sore throat.   Musculoskeletal: Positive for myalgias.  Neurological: Positive for headaches.    BP 122/86  Pulse 79  Temp(Src) 98.1 F (36.7 C)  Resp 18  Ht 6\' 3"  (1.905 m)  Wt 438 lb (198.675 kg)  BMI 54.75 kg/m2  SpO2 97% Objective:  Physical Exam  Nursing note and vitals reviewed. Constitutional: He is oriented to person, place, and time. He appears well-developed and  well-nourished.  HENT:  Head: Normocephalic and atraumatic.  Right Ear: Tympanic membrane normal.  Left Ear: Tympanic membrane normal.  Nose: Nose normal.  Mouth/Throat: Uvula is midline. Mucous membranes are dry. Posterior oropharyngeal erythema (mild) present. No oropharyngeal exudate.  Blister with small amount of pustulant vesicle on upper and lower lip on the side with dry mucous membranes.   Eyes: EOM are normal.  Neck: Normal range of motion.  Cardiovascular: Regular rhythm and normal heart sounds.  Tachycardia present.  Exam reveals no gallop and no friction rub.   No murmur heard. Pulmonary/Chest: Effort normal and breath sounds normal. No respiratory distress. He has no wheezes. He has no rales.   Abdominal: There is no CVA tenderness.  Musculoskeletal: Normal range of motion.  Lymphadenopathy:       Head (left side): Submandibular adenopathy present.  Lymphadenopathy difficult to appreciate.   Neurological: He is alert and oriented to person, place, and time.  Skin: Skin is warm and dry.  Psychiatric: He has a normal mood and affect. His behavior is normal.   Positive orthostatics.   Results for orders placed in visit on 08/13/13  POCT CBC      Result Value Ref Range   WBC 9.6  4.6 - 10.2 K/uL   Lymph, poc 2.5  0.6 - 3.4   POC LYMPH PERCENT 26.2  10 - 50 %L   MID (cbc) 0.2  0 - 0.9   POC MID % 2.4  0 - 12 %M   POC Granulocyte 6.9  2 - 6.9   Granulocyte percent 71.4  37 - 80 %G   RBC 5.33  4.69 - 6.13 M/uL   Hemoglobin 14.8  14.1 - 18.1 g/dL   HCT, POC 16.144.5  09.643.5 - 53.7 %   MCV 83.4  80 - 97 fL   MCH, POC 27.7  27 - 31.2 pg   MCHC 33.3  31.8 - 35.4 g/dL   RDW, POC 04.514.8     Platelet Count, POC 219  142 - 424 K/uL   MPV 8.3  0 - 99.8 fL   Patient reassessed after 2 L of fluid.  Headache decreased and dizziness resolved.  Cardiac, abdominal, and respiratory exam benign.  Still with positive orthostatics and has not urinated so will give another liter of fluid.    Assessment & Plan:   Will start patient on IV fluids, administer nausea medication, collect orthostatics, and complete blood work and UA in office.  Reassured patient and his mother of a viral illness, importance of pushing of fluids, avoiding of dehydration reinforced.  Out of work with symptomatic care.  Return to clinic if symptoms recur or worsen. Asthma, moderate persistent, with acute exacerbation  Fever, unspecified - Plan: POCT CBC, POCT SEDIMENTATION RATE, POCT urinalysis dipstick, POCT UA - Microscopic Only, Comprehensive metabolic panel, HIV antibody  Dehydration  Vertigo  Herpes labialis  Myalgia and myositis  Headache(784.0)  Meds ordered this encounter  Medications  . valACYclovir  (VALTREX) 1000 MG tablet    Sig: Take 2 tablets (2,000 mg total) by mouth 2 (two) times daily.    Dispense:  4 tablet    Refill:  2    I personally performed the services described in this documentation, which was scribed in my presence. The recorded information has been reviewed and considered, and addended by me as needed.  Norberto SorensonEva Shaw, MD MPH

## 2013-12-02 ENCOUNTER — Encounter: Payer: Self-pay | Admitting: Family Medicine

## 2014-11-14 ENCOUNTER — Ambulatory Visit (INDEPENDENT_AMBULATORY_CARE_PROVIDER_SITE_OTHER): Payer: BC Managed Care – PPO | Admitting: Internal Medicine

## 2014-11-14 ENCOUNTER — Ambulatory Visit (INDEPENDENT_AMBULATORY_CARE_PROVIDER_SITE_OTHER): Payer: BC Managed Care – PPO

## 2014-11-14 VITALS — BP 128/88 | HR 95 | Temp 97.5°F | Resp 18 | Ht 75.75 in | Wt >= 6400 oz

## 2014-11-14 DIAGNOSIS — M25531 Pain in right wrist: Secondary | ICD-10-CM

## 2014-11-14 DIAGNOSIS — M25521 Pain in right elbow: Secondary | ICD-10-CM | POA: Diagnosis not present

## 2014-11-14 DIAGNOSIS — R202 Paresthesia of skin: Secondary | ICD-10-CM

## 2014-11-14 MED ORDER — MELOXICAM 15 MG PO TABS: 15.0000 mg | ORAL_TABLET | Freq: Every day | ORAL | Status: DC

## 2014-11-14 NOTE — Progress Notes (Signed)
Subjective:  This chart was scribed for [name], MD by Andrew Au, ED Scribe. This patient was seen in room 3 and the patient's care was started at 3:48 PM.   Patient ID: Brandon Morales, male    DOB: 1985-03-12, 29 y.o.   MRN: [id number]  HPI Chief Complaint  Patient presents with  . Arm Pain    Right arm, starts from shoulder and spreads to finger tips. x3 days.    HPI Comments: Brandon Morales is a 29 y.o. male who presents to the [facility] complaining of right wrist pain and swelling. Pt has been waking up with excruciating right wrist pain for the past 3 days. States pain shoots up right arm to right shoulder with certain movement such as spreading his fingers and making a fist as well as occasional tingling and numbness in right fingers.  He has been playing computer games for 10+ years and has spent long hrs at this recent weeks.   Past Medical History  Diagnosis Date  . Asthma   . Anxiety     -  Psych  Prior to Admission medications   Medication Sig Start Date End Date Taking? Authorizing Provider  albuterol (PROVENTIL HFA;VENTOLIN HFA) 108 (90 BASE) MCG/ACT inhaler Inhale 2 puffs into the lungs every 6 (six) hours as needed. 07/20/11  Yes Tonye Pearson, MD  divalproex (DEPAKOTE ER) 500 MG 24 hr tablet Take 500 mg by mouth daily.   Yes Historical Provider, MD  PARoxetine (PAXIL) 20 MG tablet Take 20 mg by mouth daily.   Yes Historical Provider, MD  albuterol (PROVENTIL) (2.5 MG/3ML) 0.083% nebulizer solution Take 3 mLs (2.5 mg total) by nebulization every 6 (six) hours as needed. Patient not taking: Reported on 11/14/2014 07/20/11   Tonye Pearson, MD  beclomethasone (QVAR) 80 MCG/ACT inhaler Inhale 1 puff into the lungs as needed. Patient not taking: Reported on 11/14/2014 07/20/11   Tonye Pearson, MD  ibuprofen (ADVIL,MOTRIN) 800 MG tablet Take 1 tablet (800 mg total) by mouth every 8 (eight) hours as needed for pain. Patient  not taking: Reported on 11/14/2014 01/21/12   Thao P Le, DO  Mometasone Furo-Formoterol Fum 200-5 MCG/ACT AERO Inhale 2 puffs into the lungs 2 (two) times daily. Patient not taking: Reported on 11/14/2014 03/14/11   Elvina Sidle, MD  valACYclovir (VALTREX) 1000 MG tablet Take 2 tablets (2,000 mg total) by mouth 2 (two) times daily. Patient not taking: Reported on 11/14/2014 08/13/13   Sherren Mocha, MD   Review of Systems  Musculoskeletal: Positive for arthralgias. Negative for neck pain and neck stiffness.  Skin: Negative for color change, rash and wound.  Neurological: Positive for numbness. Negative for weakness.   Objective:   Physical Exam  Constitutional: He is oriented to person, place, and time. He appears well-developed and well-nourished. No distress.  HENT:  Head: Normocephalic and atraumatic.  Eyes: Conjunctivae and EOM are normal.  Neck: Neck supple.  Neck full ROM  Cardiovascular: Normal rate.   Pulmonary/Chest: Effort normal.  Musculoskeletal: Normal range of motion.  Minimal swelling. No redness  Joints of right arm. Marked TTP over wrist and lateral epicondyle. Bad pain with any ROM of wrist and elbow. Volar wrist tightness. Tender lateral epicondyle. Cannot make a fist due to pain. No sensory loss in fingers.   Neurological: He is alert and oriented to person, place, and time.  Skin: Skin is warm and dry.  Psychiatric: He has a normal mood  and affect. His behavior is normal.  Nursing note and vitals reviewed.    Filed Vitals:   11/14/14 1506  BP: 128/88  Pulse: 95  Temp: 97.5 F (36.4 C)  TempSrc: Oral  Resp: 18  Height: 6' 3.75" (1.924 m)  Weight: 443 lb 9.6 oz (201.216 kg)  SpO2: 97%    [facility] reading (PRIMARY) by Dr. Merla Richesoolittle. Right wrist- normal wrist.  Assessment & Plan:   1. Pain, wrist joint, right   2. Pain, elbow joint, right   3. Right hand paresthesia   4.      Morbid obesity 5.      Psych meds  Thumb spica splint --has overuse involving  wrist and lat epicond rom bid OOW(detention officer) Recheck 1-2 weeks May need PT and/or ortho referral  Meds ordered this encounter  Medications  . meloxicam (MOBIC) 15 MG tablet    Sig: Take 1 tablet (15 mg total) by mouth daily.    Dispense:  30 tablet    Refill:  0    By signing my name below, I, Raven Small, attest that this documentation has been prepared under the direction and in the presence of Ellamae Siaobert Miraj Truss, MD.  Electronically Signed: Andrew Auaven Small, ED Scribe. 11/14/2014. 4:21 PM. I have completed the patient encounter in its entirety as documented by the scribe, with editing by me where necessary. Agnieszka Newhouse P. Merla Richesoolittle, M.D.

## 2014-12-25 ENCOUNTER — Ambulatory Visit (INDEPENDENT_AMBULATORY_CARE_PROVIDER_SITE_OTHER): Payer: BC Managed Care – PPO | Admitting: Internal Medicine

## 2014-12-25 ENCOUNTER — Ambulatory Visit (INDEPENDENT_AMBULATORY_CARE_PROVIDER_SITE_OTHER): Payer: BC Managed Care – PPO

## 2014-12-25 VITALS — BP 130/90 | HR 56 | Temp 98.3°F | Resp 16 | Ht 78.0 in | Wt >= 6400 oz

## 2014-12-25 DIAGNOSIS — M791 Myalgia, unspecified site: Secondary | ICD-10-CM

## 2014-12-25 DIAGNOSIS — R6883 Chills (without fever): Secondary | ICD-10-CM | POA: Diagnosis not present

## 2014-12-25 DIAGNOSIS — R05 Cough: Secondary | ICD-10-CM

## 2014-12-25 DIAGNOSIS — R059 Cough, unspecified: Secondary | ICD-10-CM

## 2014-12-25 MED ORDER — HYDROCODONE-HOMATROPINE 5-1.5 MG/5ML PO SYRP: 5.0000 mL | ORAL_SOLUTION | Freq: Four times a day (QID) | ORAL | Status: DC | PRN

## 2014-12-25 MED ORDER — AMOXICILLIN-POT CLAVULANATE 875-125 MG PO TABS: 1.0000 | ORAL_TABLET | Freq: Two times a day (BID) | ORAL | Status: DC

## 2014-12-25 NOTE — Progress Notes (Signed)
Subjective:  This chart was scribed for Brandon Sia, MD by Stann Ore, Medical Scribe. This patient was seen in Room 5 and the patient's care was started at 2:48 PM.    Patient ID: Brandon Morales, male    DOB: [birth year], 29 y.o.   MRN: [id number] Chief Complaint  Patient presents with  . Cough    x 1 day  . URI    HPI Brandon Morales is a 29 y.o. male who presents to [facility] complaining of gradual onset URI symptoms including cough that started this morning. He describes that it feels like "being hit by a truck." He has nausea, intermittent fever, and chills. 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 He has history of chronic bronchitis throughout his life. He informs that he received a flu shot this year.   He has an inhaler for his asthma; although, he says that he hasn't had the need to use it.   Patient Active Problem List   Diagnosis Date Noted  . BMI 50.0-59.9, adult (HCC) 07/20/2011  . Asthma 03/11/2011  . Obesity 03/11/2011    Current outpatient prescriptions:  .  albuterol (PROVENTIL) (2.5 MG/3ML) 0.083% nebulizer solution, Take 3 mLs (2.5 mg total) by nebulization every 6 (six) hours as needed., Disp: 75 mL, Rfl: 11 .  beclomethasone (QVAR) 80 MCG/ACT inhaler, Inhale 1 puff into the lungs as needed., Disp: 1 Inhaler, Rfl: 11 .  divalproex (DEPAKOTE ER) 500 MG 24 hr tablet, Take 500 mg by mouth daily., Disp: , Rfl:  .  PARoxetine (PAXIL) 20 MG tablet, Take 20 mg by mouth daily., Disp: , Rfl:  .  albuterol (PROVENTIL HFA;VENTOLIN HFA) 108 (90 BASE) MCG/ACT inhaler, Inhale 2 puffs into the lungs every 6 (six) hours as needed., Disp: 1 Inhaler, Rfl: 11 .  Mometasone Furo-Formoterol Fum 200-5 MCG/ACT AERO, Inhale 2 puffs into the lungs 2 (two) times daily. (Patient not taking: Reported on 11/14/2014), Disp: 1 Inhaler, Rfl: 3 .  valACYclovir (VALTREX) 1000 MG tablet, Take 2 tablets (2,000 mg total) by mouth 2 (two) times daily. (Patient not taking: Reported on  11/14/2014), Disp: 4 tablet, Rfl: 2    Review of Systems  Constitutional: Positive for fever, chills and fatigue.  HENT: Positive for congestion. Negative for rhinorrhea, sinus pressure and sore throat.   Respiratory: Positive for cough and chest tightness.   Gastrointestinal: Positive for nausea. Negative for vomiting, diarrhea and constipation.  Neurological: Negative for dizziness.       Objective:   Physical Exam  Constitutional: He is oriented to person, place, and time. He appears well-developed and well-nourished. No distress.  Morbidly obese  HENT:  Head: Normocephalic and atraumatic.  Right Ear: External ear normal.  Left Ear: External ear normal.  Nose: Nose normal.  Mouth/Throat: Oropharynx is clear and moist.  Eyes: Conjunctivae and EOM are normal. Pupils are equal, round, and reactive to light.  Neck: Neck supple.  Cardiovascular: Normal rate, regular rhythm and normal heart sounds.   Pulmonary/Chest: Effort normal. No respiratory distress.  Generalized rhonchi with mild wheezing on forced expiration  Musculoskeletal: Normal range of motion.  Lymphadenopathy:    He has no cervical adenopathy.  Neurological: He is alert and oriented to person, place, and time.  Skin: Skin is warm and dry.  Psychiatric: He has a normal mood and affect. His behavior is normal.  Nursing note and vitals reviewed.   BP 130/90 mmHg  Pulse 56  Temp(Src) 98.3 F (36.8 C) (Oral)  Resp  16  Ht 6\' 6"  (1.981 m)  Wt 454 lb (205.933 kg)  BMI 52.48 kg/m2  SpO2 98%   [facility] reading (PRIMARY) by Dr. Merla Richesoolittle : chest xray: ? Right middle lobe infiltrate versus under penetration (rad says ok)     Assessment & Plan:  Cough - Plan: DG Chest 2 View  Chills - Plan: DG Chest 2 View  Myalgia  Suspect acute exacerbation of chr bronchitis Meds ordered this encounter  Medications  . HYDROcodone-homatropine (HYCODAN) 5-1.5 MG/5ML syrup    Sig: Take 5 mLs by mouth every 6 (six) hours as  needed.    Dispense:  120 mL    Refill:  0  . amoxicillin-clavulanate (AUGMENTIN) 875-125 MG tablet    Sig: Take 1 tablet by mouth 2 (two) times daily.    Dispense:  20 tablet    Refill:  0   Had flyu shot but could be acute influ diff str oow 2-3 d Fu 3d if not resp sooner if worse Obesity continues to be major issue that he doesn't recognize or want to alter!!!!!!!!!   By signing my name below, I, Stann Oresung-Kai Tsai, attest that this documentation has been prepared under the direction and in the presence of Brandon Siaobert Niam Nepomuceno, MD. Electronically Signed: Stann Oresung-Kai Tsai, Scribe. 12/25/2014 , 2:53 PM . I have completed the patient encounter in its entirety as documented by the scribe, with editing by me where necessary. Kimberleigh Mehan P. Merla Richesoolittle, M.D.

## 2015-05-17 ENCOUNTER — Ambulatory Visit: Payer: BC Managed Care – PPO | Admitting: Dietician

## 2015-06-07 ENCOUNTER — Encounter (HOSPITAL_COMMUNITY): Payer: Self-pay | Admitting: *Deleted

## 2015-06-07 ENCOUNTER — Emergency Department (HOSPITAL_COMMUNITY)
Admission: EM | Admit: 2015-06-07 | Discharge: 2015-06-07 | Disposition: A | Discharge status: Home / Self Care | Payer: BC Managed Care – PPO | Attending: Emergency Medicine | Admitting: Emergency Medicine

## 2015-06-07 ENCOUNTER — Emergency Department (HOSPITAL_COMMUNITY): Payer: BC Managed Care – PPO

## 2015-06-07 DIAGNOSIS — J45901 Unspecified asthma with (acute) exacerbation: Secondary | ICD-10-CM | POA: Insufficient documentation

## 2015-06-07 DIAGNOSIS — F4310 Post-traumatic stress disorder, unspecified: Secondary | ICD-10-CM | POA: Diagnosis not present

## 2015-06-07 DIAGNOSIS — Z87891 Personal history of nicotine dependence: Secondary | ICD-10-CM | POA: Insufficient documentation

## 2015-06-07 DIAGNOSIS — F419 Anxiety disorder, unspecified: Secondary | ICD-10-CM | POA: Insufficient documentation

## 2015-06-07 DIAGNOSIS — Z792 Long term (current) use of antibiotics: Secondary | ICD-10-CM | POA: Diagnosis not present

## 2015-06-07 DIAGNOSIS — Z79899 Other long term (current) drug therapy: Secondary | ICD-10-CM | POA: Diagnosis not present

## 2015-06-07 DIAGNOSIS — R0602 Shortness of breath: Secondary | ICD-10-CM | POA: Diagnosis present

## 2015-06-07 LAB — I-STAT VENOUS BLOOD GAS, ED
Bicarbonate: 24.3 mEq/L — ABNORMAL HIGH (ref 20.0–24.0)
O2 Saturation: 96 %
PCO2 VEN: 38.9 mmHg — AB (ref 45.0–50.0)
PH VEN: 7.403 — AB (ref 7.250–7.300)
PO2 VEN: 84 mmHg — AB (ref 31.0–45.0)
TCO2: 25 mmol/L (ref 0–100)

## 2015-06-07 LAB — D-DIMER, QUANTITATIVE (NOT AT ARMC)

## 2015-06-07 LAB — CBC
HEMATOCRIT: 38.9 % — AB (ref 39.0–52.0)
Hemoglobin: 12.7 g/dL — ABNORMAL LOW (ref 13.0–17.0)
MCH: 27 pg (ref 26.0–34.0)
MCHC: 32.6 g/dL (ref 30.0–36.0)
MCV: 82.6 fL (ref 78.0–100.0)
Platelets: 213 10*3/uL (ref 150–400)
RBC: 4.71 MIL/uL (ref 4.22–5.81)
RDW: 13.7 % (ref 11.5–15.5)
WBC: 5.9 10*3/uL (ref 4.0–10.5)

## 2015-06-07 LAB — BASIC METABOLIC PANEL
Anion gap: 8 (ref 5–15)
BUN: 12 mg/dL (ref 6–20)
CALCIUM: 8.6 mg/dL — AB (ref 8.9–10.3)
CO2: 24 mmol/L (ref 22–32)
Chloride: 105 mmol/L (ref 101–111)
Creatinine, Ser: 0.87 mg/dL (ref 0.61–1.24)
GFR calc Af Amer: >60 mL/min (ref >60)
GLUCOSE: 213 mg/dL — AB (ref 65–99)
POTASSIUM: 3.4 mmol/L — AB (ref 3.5–5.1)
Sodium: 137 mmol/L (ref 135–145)

## 2015-06-07 LAB — I-STAT TROPONIN, ED
TROPONIN I, POC: 0 ng/mL (ref 0.00–0.08)
TROPONIN I, POC: 0 ng/mL (ref 0.00–0.08)

## 2015-06-07 MED ORDER — METHYLPREDNISOLONE SODIUM SUCC 125 MG IJ SOLR
125.0000 mg | Freq: Once | INTRAMUSCULAR | Status: AC
  Administered 2015-06-07: 125 mg via INTRAVENOUS
  Filled 2015-06-07: qty 2

## 2015-06-07 MED ORDER — ALBUTEROL SULFATE (2.5 MG/3ML) 0.083% IN NEBU
2.5000 mg | INHALATION_SOLUTION | Freq: Once | RESPIRATORY_TRACT | Status: AC
  Administered 2015-06-07: 2.5 mg via RESPIRATORY_TRACT
  Filled 2015-06-07: qty 3

## 2015-06-07 NOTE — ED Notes (Signed)
Pt presents via [facility] from home c/o SOB since around 3pm.  Hx: asthma and bronchitis.  Pt also reports chest pressure consistent with asthma attacks.  Pt used rescue inhaler at home, unable to find home neb.  Lungs clear, pt reports non-productive cough today.  EKG unremarkable, 324 ASA given.  BP-122 palp, O2-98% RA, R-24, HR 108 ST.  20g L H.  A x 4, NAD, airway intact.

## 2015-06-07 NOTE — ED Notes (Signed)
Patient transported to X-ray 

## 2015-06-07 NOTE — ED Provider Notes (Signed)
CSN: [id number]     Arrival date & time 06/07/15  1635 History   First MD Initiated Contact with Patient 06/07/15 1640     Chief Complaint  Patient presents with  . Shortness of Breath   HPI Patient is a 30 year old male with past medical history of PTSD, mild intermittent asthma, who presents with shortness of breath. Patient reports she has taken up and woke up at 3 PM today. Following is now appears in the shower and he began having worsening cough and shortness of breath. He was unable to find his home albuterol nebulizer but didn't take some puffs from his inhaler which did not relieve his symptoms. He has some associated chest tightness. Denies fevers, chills, nausea or other recent illness. 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 He is not a smoker. Denies cocaine or other drug use.   Past Medical History  Diagnosis Date  . Asthma   . Anxiety    Past Surgical History  Procedure Laterality Date  . Tonsillectomy     Family History  Problem Relation Age of Onset  . Diabetes Mother   . Hypertension Mother   . Diabetes Maternal Grandmother    Social History  Substance Use Topics  . Smoking status: Former Smoker    Quit date: 03/10/2008  . Smokeless tobacco: None  . Alcohol Use: No    Review of Systems  Constitutional: Negative for fever and chills.  HENT: Negative for congestion and rhinorrhea.   Eyes: Negative for visual disturbance.  Respiratory: Positive for cough, chest tightness, shortness of breath and wheezing.   Cardiovascular: Negative for palpitations and leg swelling.  Gastrointestinal: Negative for nausea, vomiting, abdominal pain and diarrhea.  Genitourinary: Negative for dysuria and difficulty urinating.  Musculoskeletal: Negative for back pain.  Skin: Negative for pallor.  Neurological: Negative for dizziness and headaches.  Psychiatric/Behavioral: Negative for confusion. The patient is  nervous/anxious.   All other systems reviewed and are negative.     Allergies  Erythromycin and Zithromax  Home Medications   Prior to Admission medications   Medication Sig Start Date End Date Taking? Authorizing Provider  albuterol (PROVENTIL HFA;VENTOLIN HFA) 108 (90 BASE) MCG/ACT inhaler Inhale 2 puffs into the lungs every 6 (six) hours as needed. 07/20/11   Tonye Pearsonobert P Doolittle, MD  albuterol (PROVENTIL) (2.5 MG/3ML) 0.083% nebulizer solution Take 3 mLs (2.5 mg total) by nebulization every 6 (six) hours as needed. 07/20/11   Tonye Pearsonobert P Doolittle, MD  amoxicillin-clavulanate (AUGMENTIN) 875-125 MG tablet Take 1 tablet by mouth 2 (two) times daily. 12/25/14   Tonye Pearsonobert P Doolittle, MD  beclomethasone (QVAR) 80 MCG/ACT inhaler Inhale 1 puff into the lungs as needed. 07/20/11   Tonye Pearsonobert P Doolittle, MD  divalproex (DEPAKOTE ER) 500 MG 24 hr tablet Take 500 mg by mouth daily.    Historical Provider, MD  HYDROcodone-homatropine (HYCODAN) 5-1.5 MG/5ML syrup Take 5 mLs by mouth every 6 (six) hours as needed. 12/25/14   Tonye Pearsonobert P Doolittle, MD  Mometasone Furo-Formoterol Fum 200-5 MCG/ACT AERO Inhale 2 puffs into the lungs 2 (two) times daily. Patient not taking: Reported on 11/14/2014 03/14/11   Elvina SidleKurt Lauenstein, MD  PARoxetine (PAXIL) 20 MG tablet Take 20 mg by mouth daily.    Historical Provider, MD  valACYclovir (VALTREX) 1000 MG tablet Take 2 tablets (2,000 mg total) by mouth 2 (two) times daily. Patient not taking: Reported on 11/14/2014 08/13/13   Sherren MochaEva N Shaw, MD  BP 128/83 mmHg  Pulse 99  Temp(Src) 97.8 F (36.6 C) (Oral)  Resp 14  SpO2 99% Physical Exam  Constitutional: He is oriented to person, place, and time. He appears well-developed and well-nourished.  HENT:  Head: Normocephalic and atraumatic.  Eyes: EOM are normal. Pupils are equal, round, and reactive to light.  Neck: Normal range of motion. Neck supple.  Cardiovascular: Normal rate, regular rhythm and intact distal pulses.    Pulmonary/Chest: Effort normal. No respiratory distress.  Distant breath sounds. Small coughing fit on exam. No wheezes appreciated.   Abdominal: Soft. He exhibits no distension. There is no tenderness.  Morbidly obese  Musculoskeletal: Normal range of motion. He exhibits no edema or tenderness.  No calf swelling, erythema, or tenderness  Neurological: He is alert and oriented to person, place, and time.  Skin: Skin is warm and dry. No rash noted.  Psychiatric: He has a normal mood and affect.  Nursing note and vitals reviewed.   ED Course  Procedures (including critical care time) Labs Review Labs Reviewed  BASIC METABOLIC PANEL - Abnormal; Notable for the following:    Potassium 3.4 (*)    Glucose, Bld 213 (*)    Calcium 8.6 (*)    All other components within normal limits  CBC - Abnormal; Notable for the following:    Hemoglobin 12.7 (*)    HCT 38.9 (*)    All other components within normal limits  I-STAT VENOUS BLOOD GAS, ED - Abnormal; Notable for the following:    pH, Ven 7.403 (*)    pCO2, Ven 38.9 (*)    pO2, Ven 84.0 (*)    Bicarbonate 24.3 (*)    All other components within normal limits  D-DIMER, QUANTITATIVE (NOT AT [facility])  I-STAT TROPOININ, ED  Rosezena Sensor, ED    Imaging Review Dg Chest 2 View  06/07/2015  CLINICAL DATA:  Chest pain EXAM: CHEST  2 VIEW COMPARISON:  12/25/2014 FINDINGS: The heart size and mediastinal contours are within normal limits. Both lungs are clear. The visualized skeletal structures are unremarkable. IMPRESSION: No active cardiopulmonary disease. Electronically Signed   By: Alcide Clever M.D.   On: 06/07/2015 18:29   I have personally reviewed and evaluated these images and lab results as part of my medical decision-making.   EKG Interpretation   Date/Time:  Tuesday Jun 07 2015 16:35:54 EDT Ventricular Rate:  92 PR Interval:  148 QRS Duration: 109 QT Interval:  374 QTC Calculation: 463 R Axis:   61 Text Interpretation:   Sinus rhythm No previous ECGs available Confirmed by  Manus Gunning  MD, STEPHEN [phone]) on 06/07/2015 4:55:14 PM      MDM   Final diagnoses:  Shortness of breath    Patient is a 30 year old male with past medical history of asthma and anxiety presents with increased shortness of breath since 3 PM today.  Afebrile, normotensive with O2 sats 99% on 1 L. Physical exam is unremarkable other than distant breath sounds. Patient has occasional coughing on exam but does not have any appreciable wheezes. Treated presumptively for asthma exacerbation with steroids and albuterol nebulizer treatment.   Given an onset shortness of breath, d-dimer obtained and is within normal limits. Doubt PE. Patient is obese but otherwise does not have significant coronary artery disease risk factors. EKG is normal without acute ischemic changes. Delta troponin obtained and is undetectable 2. Doubt ACS. Chest x-ray does not show pneumonia, pneumothorax or other pulmonary pathology. Heart size is normal. Labs are unremarkable  other than hyperglycemia of 213. Patient is not hypercarbic.  On reevaluation patient reports his work of breathing has improved. He is not wheezing. He is ambulatory around the emergency department without significant hypoxia. Discharged in stable condition. Return precautions discussed. Advised patient that he should follow up with his primary care doctor to discuss a possible sleep study. Patient reports understanding and agreement with this plan.  Patient seen and discussed with Dr. Manus Gunning, ED attending  Isa Rankin, MD 06/08/15 1610  Glynn Octave, MD 06/08/15 [phone]

## 2015-06-07 NOTE — Discharge Instructions (Signed)

## 2015-06-07 NOTE — ED Notes (Signed)
Pt able to ambulate without difficulty or assistance; pt denied any dizziness, lightheadedness, weakness. Pt had increased work of breathing throughout, but stated he felt the same as when he first started walking. Steady gait throughout.  Highest HR and spO2: 118bpm  100%  Lowest HR and spO2: 112bpm  93%

## 2015-06-20 ENCOUNTER — Encounter: Payer: Self-pay | Admitting: Family Medicine

## 2015-06-20 ENCOUNTER — Ambulatory Visit (INDEPENDENT_AMBULATORY_CARE_PROVIDER_SITE_OTHER): Payer: BC Managed Care – PPO | Admitting: Family Medicine

## 2015-06-20 VITALS — BP 134/80 | HR 121 | Temp 98.7°F | Resp 16 | Ht 78.0 in | Wt >= 6400 oz

## 2015-06-20 DIAGNOSIS — R739 Hyperglycemia, unspecified: Secondary | ICD-10-CM

## 2015-06-20 DIAGNOSIS — J4531 Mild persistent asthma with (acute) exacerbation: Secondary | ICD-10-CM | POA: Diagnosis not present

## 2015-06-20 DIAGNOSIS — R0602 Shortness of breath: Secondary | ICD-10-CM

## 2015-06-20 DIAGNOSIS — G44219 Episodic tension-type headache, not intractable: Secondary | ICD-10-CM | POA: Diagnosis not present

## 2015-06-20 DIAGNOSIS — R42 Dizziness and giddiness: Secondary | ICD-10-CM

## 2015-06-20 DIAGNOSIS — E119 Type 2 diabetes mellitus without complications: Secondary | ICD-10-CM | POA: Diagnosis not present

## 2015-06-20 LAB — POCT CBC
Granulocyte percent: 84.5 %G — AB (ref 37–80)
HCT, POC: 36.5 % — AB (ref 43.5–53.7)
HEMOGLOBIN: 12.7 g/dL — AB (ref 14.1–18.1)
Lymph, poc: 1.5 (ref 0.6–3.4)
MCH: 28.2 pg (ref 27–31.2)
MCHC: 34.8 g/dL (ref 31.8–35.4)
MCV: 81.3 fL (ref 80–97)
MID (cbc): 0.3 (ref 0–0.9)
MPV: 7.6 fL (ref 0–99.8)
PLATELET COUNT, POC: 186 10*3/uL (ref 142–424)
POC Granulocyte: 9.7 — AB (ref 2–6.9)
POC LYMPH PERCENT: 12.7 %L (ref 10–50)
POC MID %: 2.8 %M (ref 0–12)
RBC: 4.49 M/uL — AB (ref 4.69–6.13)
RDW, POC: 14.2 %
WBC: 11.5 10*3/uL — AB (ref 4.6–10.2)

## 2015-06-20 LAB — POCT URINALYSIS DIP (MANUAL ENTRY)
BILIRUBIN UA: NEGATIVE
Bilirubin, UA: NEGATIVE
Blood, UA: NEGATIVE
Glucose, UA: 250 — AB
Leukocytes, UA: NEGATIVE
Nitrite, UA: NEGATIVE
PH UA: 6.5
PROTEIN UA: NEGATIVE
SPEC GRAV UA: 1.015
Urobilinogen, UA: 0.2

## 2015-06-20 LAB — POCT GLYCOSYLATED HEMOGLOBIN (HGB A1C): HEMOGLOBIN A1C: 7.3

## 2015-06-20 LAB — POC MICROSCOPIC URINALYSIS (UMFC)

## 2015-06-20 LAB — GLUCOSE, POCT (MANUAL RESULT ENTRY): POC GLUCOSE: 320 mg/dL — AB (ref 70–99)

## 2015-06-20 MED ORDER — AMOXICILLIN 500 MG PO TABS: 1000.0000 mg | ORAL_TABLET | Freq: Two times a day (BID) | ORAL | Status: DC

## 2015-06-20 MED ORDER — METFORMIN HCL 500 MG PO TABS: 500.0000 mg | ORAL_TABLET | Freq: Two times a day (BID) | ORAL | Status: AC

## 2015-06-20 NOTE — Patient Instructions (Addendum)
   IF you received an x-ray today, you will receive an invoice from [facility]. Please contact [facility] at [phone] with questions or concerns regarding your invoice.   IF you received labwork today, you will receive an invoice from [facility]. Please contact [facility] at [phone] with questions or concerns regarding your invoice.   Our billing staff will not be able to assist you with questions regarding bills from these companies.  You will be contacted with the lab results as soon as they are available. The fastest way to get your results is to activate your My Chart account. Instructions are located on the last page of this paperwork. If you have not heard from us regarding the results in 2 weeks, please contact this office.     Basic Carbohydrate Counting for Diabetes Mellitus Carbohydrate counting is a method for keeping track of the amount of carbohydrates you eat. Eating carbohydrates naturally increases the level of sugar (glucose) in your blood, so it is important for you to know the amount that is okay for you to have in every meal. Carbohydrate counting helps keep the level of glucose in your blood within normal limits. The amount of carbohydrates allowed is different for every person. A dietitian can help you calculate the amount that is right for you. Once you know the amount of carbohydrates you can have, you can count the carbohydrates in the foods you want to eat. Carbohydrates are found in the following foods:  Grains, such as breads and cereals.  Dried beans and soy products.  Starchy vegetables, such as potatoes, peas, and corn.  Fruit and fruit juices.  Milk and yogurt.  Sweets and snack foods, such as cake, cookies, candy, chips, soft drinks, and fruit drinks. CARBOHYDRATE COUNTING There are two ways to count the carbohydrates in your food. You can use either of the methods or a combination of both. Reading  the "Nutrition Facts" on Packaged Food The "Nutrition Facts" is an area that is included on the labels of almost all packaged food and beverages in the [place]. It includes the serving size of that food or beverage and information about the nutrients in each serving of the food, including the grams (g) of carbohydrate per serving.  Decide the number of servings of this food or beverage that you will be able to eat or drink. Multiply that number of servings by the number of grams of carbohydrate that is listed on the label for that serving. The total will be the amount of carbohydrates you will be having when you eat or drink this food or beverage. Learning Standard Serving Sizes of Food When you eat food that is not packaged or does not include "Nutrition Facts" on the label, you need to measure the servings in order to count the amount of carbohydrates.A serving of most carbohydrate-rich foods contains about 15 g of carbohydrates. The following list includes serving sizes of carbohydrate-rich foods that provide 15 g ofcarbohydrate per serving:   1 slice of bread (1 oz) or 1 six-inch tortilla.    of a hamburger bun or English muffin.  4-6 crackers.   cup unsweetened dry cereal.    cup hot cereal.   cup rice or pasta.    cup mashed potatoes or  of a large baked potato.  1 cup fresh fruit or one small piece of fruit.    cup canned or frozen fruit or fruit juice.  1 cup milk.   cup   plain fat-free yogurt or yogurt sweetened with artificial sweeteners.   cup cooked dried beans or starchy vegetable, such as peas, corn, or potatoes.  Decide the number of standard-size servings that you will eat. Multiply that number of servings by 15 (the grams of carbohydrates in that serving). For example, if you eat 2 cups of strawberries, you will have eaten 2 servings and 30 g of carbohydrates (2 servings x 15 g = 30 g). For foods such as soups and casseroles, in which more than one  food is mixed in, you will need to count the carbohydrates in each food that is included. EXAMPLE OF CARBOHYDRATE COUNTING Sample Dinner  3 oz chicken breast.   cup of brown rice.   cup of corn.  1 cup milk.   1 cup strawberries with sugar-free whipped topping.  Carbohydrate Calculation Step 1: Identify the foods that contain carbohydrates:   Rice.   Corn.   Milk.   Strawberries. Step 2:Calculate the number of servings eaten of each:   2 servings of rice.   1 serving of corn.   1 serving of milk.   1 serving of strawberries. Step 3: Multiply each of those number of servings by 15 g:   2 servings of rice x 15 g = 30 g.   1 serving of corn x 15 g = 15 g.   1 serving of milk x 15 g = 15 g.   1 serving of strawberries x 15 g = 15 g. Step 4: Add together all of the amounts to find the total grams of carbohydrates eaten: 30 g + 15 g + 15 g + 15 g = 75 g.   This information is not intended to replace advice given to you by your health care provider. Make sure you discuss any questions you have with your health care provider.   Document Released: 01/01/2005 Document Revised: 01/22/2014 Document Reviewed: 11/28/2012 Elsevier Interactive Patient Education 2016 Elsevier Inc.  

## 2015-06-20 NOTE — Progress Notes (Signed)
Subjective:    Patient ID: Brandon Morales, male    DOB: [birth year], 30 y.o.   MRN: [id number]  06/20/2015  Wheezing   HPI This 30 y.o. male presents for evaluation of  On his way to work, onset of shortness of breath. Advised mother to call 911 on 06/07/15.  Seen in ED; diagnosed with asthma exacerbation which pt and mother was aware of. Nebulizer and oxygen.  Not sure of other medications. The next day, folowed up with Dr. Yetta Barre who prescribed inhalers, Breo.  Pt tried to advise Dr. Yetta Barre that he needed Prednisone and Albuterol.  Then one week ago on 06/14/15 went back to Dr. Yetta Barre; still having DOE/SOB.  Prescribed Albuterol and Prednisone.  Taking tapered Prednisone.  Taking 3 Prednisone tablets per day; has not been using nebulizer; cough is manageable with inhaler.  Until today; when he got dressed, he sounded thick chested. Progressively worsened; on the way to work, called mother and advised that must be seen again. Has been lightheaded.  Not coherent.  Horribly nauseated.    Not sure if had fever today; no chills/sweats today; did have sweats this week.  +ST mild; no ear pain.  Chronic nasal congestion and rhinorrhea; green congestion.  +frontal sinus pain; +HA.  Cough waxes and wanes; gets restricted and improved; cyclical cough.  Having persistent wheezing.  +nausea; no vomiting; no diarrhea.  Regular bowel movements.  +dizziness started today.  Drinks a lot of water to have clear urination.  Suffering with severe headache and dizziness and nausea; having a hard time comprehending and he feels spacy.  Mother concerned about sinusitis.  Glucose of 213 in ED on 06/07/15.  No known DMII.    Personnel officer.  On feet throughout shift.   Mother is diabetic.    L:  2 burgers [employer], jalepeno fries, water; soda 1 glass; energy tube with tarreen.  D:  Steak, egg, cheese sandwich x 2, sloppy joes, chicken breast stuffed.  Gatorade or sodas and water. Yogurt, small bag chips 100  calories. B:  cereal, milk    Review of Systems  Constitutional: Negative for fever, chills, diaphoresis, activity change, appetite change and fatigue.  HENT: Positive for congestion, postnasal drip, rhinorrhea, sinus pressure and sore throat. Negative for ear pain.   Respiratory: Negative for cough and shortness of breath.   Cardiovascular: Negative for chest pain, palpitations and leg swelling.  Gastrointestinal: Positive for nausea. Negative for vomiting, abdominal pain and diarrhea.  Endocrine: Negative for cold intolerance, heat intolerance, polydipsia, polyphagia and polyuria.  Skin: Negative for color change, rash and wound.  Neurological: Positive for dizziness, light-headedness and headaches. Negative for tremors, seizures, syncope, facial asymmetry, speech difficulty, weakness and numbness.  Psychiatric/Behavioral: Positive for confusion and decreased concentration. Negative for sleep disturbance and dysphoric mood. The patient is not nervous/anxious.     Past Medical History  Diagnosis Date  . Asthma   . Anxiety   . Hyperglycemia    Past Surgical History  Procedure Laterality Date  . Tonsillectomy     Allergies  Allergen Reactions  . Erythromycin     Hives, itching   . Zithromax [Azithromycin Dihydrate]     unknown    Social History   Social History  . Marital Status: Single    Spouse Name: N/A  . Number of Children: 0  . Years of Education: N/A   Occupational History  . Corporate treasurer    Social History Main Topics  . Smoking status: Former Smoker  Quit date: 03/10/2008  . Smokeless tobacco: Not on file  . Alcohol Use: No  . Drug Use: No  . Sexual Activity: Not on file   Other Topics Concern  . Not on file   Social History Narrative   Mother lives with the patient.    Family History  Problem Relation Age of Onset  . Diabetes Mother   . Hypertension Mother   . Diabetes Maternal Grandmother        Objective:    BP 134/80 mmHg   Pulse 121  Temp(Src) 98.7 F (37.1 C) (Oral)  Resp 16  Ht 6\' 6"  (1.981 m)  Wt 453 lb (205.479 kg)  BMI 52.36 kg/m2  SpO2 97% Physical Exam  Constitutional: He is oriented to person, place, and time. He appears well-developed and well-nourished. No distress.  Morbidly obese.  HENT:  Head: Normocephalic and atraumatic.  Right Ear: Tympanic membrane, external ear and ear canal normal.  Left Ear: Tympanic membrane, external ear and ear canal normal.  Nose: Mucosal edema and rhinorrhea present. Right sinus exhibits maxillary sinus tenderness. Right sinus exhibits no frontal sinus tenderness. Left sinus exhibits maxillary sinus tenderness. Left sinus exhibits no frontal sinus tenderness.  Mouth/Throat: Uvula is midline and mucous membranes are normal. Posterior oropharyngeal erythema present. No oropharyngeal exudate, posterior oropharyngeal edema or tonsillar abscesses.  Eyes: Conjunctivae and EOM are normal. Pupils are equal, round, and reactive to light.  Neck: Normal range of motion. Neck supple. Carotid bruit is not present. No thyromegaly present.  Cardiovascular: Normal rate, regular rhythm, normal heart sounds and intact distal pulses.  Exam reveals no gallop and no friction rub.   No murmur heard. Pulmonary/Chest: Effort normal and breath sounds normal. He has no wheezes. He has no rales.  Abdominal: Soft. Bowel sounds are normal. He exhibits no distension and no mass. There is no tenderness. There is no rebound and no guarding.  Lymphadenopathy:    He has no cervical adenopathy.  Neurological: He is alert and oriented to person, place, and time. No cranial nerve deficit. He exhibits normal muscle tone. Coordination normal.  Skin: Skin is warm and dry. No rash noted. He is not diaphoretic.  Psychiatric: He has a normal mood and affect. His behavior is normal.  Nursing note and vitals reviewed.  Results for orders placed or performed in visit on 06/20/15  Comprehensive metabolic  panel  Result Value Ref Range   Sodium 136 135 - 146 mmol/L   Potassium 3.9 3.5 - 5.3 mmol/L   Chloride 104 98 - 110 mmol/L   CO2 22 20 - 31 mmol/L   Glucose, Bld 290 (H) 65 - 99 mg/dL   BUN 18 7 - 25 mg/dL   Creat 1.61 0.96 - 0.45 mg/dL   Total Bilirubin 0.4 0.2 - 1.2 mg/dL   Alkaline Phosphatase 48 40 - 115 U/L   AST 20 10 - 40 U/L   ALT 28 9 - 46 U/L   Total Protein 5.8 (L) 6.1 - 8.1 g/dL   Albumin 3.2 (L) 3.6 - 5.1 g/dL   Calcium 8.2 (L) 8.6 - 10.3 mg/dL  POCT CBC  Result Value Ref Range   WBC 11.5 (A) 4.6 - 10.2 K/uL   Lymph, poc 1.5 0.6 - 3.4   POC LYMPH PERCENT 12.7 10 - 50 %L   MID (cbc) 0.3 0 - 0.9   POC MID % 2.8 0 - 12 %M   POC Granulocyte 9.7 (A) 2 - 6.9   Granulocyte percent  84.5 (A) 37 - 80 %G   RBC 4.49 (A) 4.69 - 6.13 M/uL   Hemoglobin 12.7 (A) 14.1 - 18.1 g/dL   HCT, POC 16.136.5 (A) 09.643.5 - 53.7 %   MCV 81.3 80 - 97 fL   MCH, POC 28.2 27 - 31.2 pg   MCHC 34.8 31.8 - 35.4 g/dL   RDW, POC 04.514.2 %   Platelet Count, POC 186 142 - 424 K/uL   MPV 7.6 0 - 99.8 fL  POCT glucose (manual entry)  Result Value Ref Range   POC Glucose 320 (A) 70 - 99 mg/dl  POCT glycosylated hemoglobin (Hb A1C)  Result Value Ref Range   Hemoglobin A1C 7.3   POCT urinalysis dipstick  Result Value Ref Range   Color, UA yellow yellow   Clarity, UA clear clear   Glucose, UA =250 (A) negative   Bilirubin, UA negative negative   Ketones, POC UA negative negative   Spec Grav, UA 1.015    Blood, UA negative negative   pH, UA 6.5    Protein Ur, POC negative negative   Urobilinogen, UA 0.2    Nitrite, UA Negative Negative   Leukocytes, UA Negative Negative  POCT Microscopic Urinalysis ([facility])  Result Value Ref Range   WBC,UR,HPF,POC None None WBC/hpf   RBC,UR,HPF,POC None None RBC/hpf   Bacteria Few (A) None, Too numerous to count   Mucus Present (A) Absent   Epithelial Cells, UR Per Microscopy Few (A) None, Too numerous to count cells/hpf   EKG: sinus tachycardia. No ST changes.   Rate 113.    Assessment & Plan:   1. Type 2 diabetes mellitus without complication, without long-term current use of insulin (HCC)   2. Episodic tension-type headache, not intractable   3. Dizziness   4. Hyperglycemia   5. Shortness of breath   6. Asthma with acute exacerbation, mild persistent    -New onset DMII. -stop Prednisone for asthma exacerbation. -start Amoxicillin for sinusitis. -start Metformin for diabetes; mother diabetic. Follow-up with PCP in upcoming 1-2 weeks.   Orders Placed This Encounter  Procedures  . Comprehensive metabolic panel  . POCT CBC  . POCT glucose (manual entry)  . POCT glycosylated hemoglobin (Hb A1C)  . POCT urinalysis dipstick  . POCT Microscopic Urinalysis ([facility])  . EKG 12-Lead   Meds ordered this encounter  Medications  . DULERA 100-5 MCG/ACT AERO    Sig:   . DISCONTD: predniSONE (STERAPRED UNI-PAK 21 TAB) 10 MG (21) TBPK tablet    Sig: Reported on 07/04/2015  . DISCONTD: amoxicillin (AMOXIL) 500 MG tablet    Sig: Take 2 tablets (1,000 mg total) by mouth 2 (two) times daily.    Dispense:  40 tablet    Refill:  0  . metFORMIN (GLUCOPHAGE) 500 MG tablet    Sig: Take 1 tablet (500 mg total) by mouth 2 (two) times daily with a meal.    Dispense:  60 tablet    Refill:  2    No Follow-up on file.    Kristi Paulita FujitaMartin Smith, M.D. [facility]  [facility] [address] [place], [place]  [id number] [phone] phone [phone]) [phone] fax

## 2015-06-21 LAB — COMPREHENSIVE METABOLIC PANEL
ALK PHOS: 48 U/L (ref 40–115)
ALT: 28 U/L (ref 9–46)
AST: 20 U/L (ref 10–40)
Albumin: 3.2 g/dL — ABNORMAL LOW (ref 3.6–5.1)
BILIRUBIN TOTAL: 0.4 mg/dL (ref 0.2–1.2)
BUN: 18 mg/dL (ref 7–25)
CO2: 22 mmol/L (ref 20–31)
Calcium: 8.2 mg/dL — ABNORMAL LOW (ref 8.6–10.3)
Chloride: 104 mmol/L (ref 98–110)
Creat: 0.88 mg/dL (ref 0.60–1.35)
GLUCOSE: 290 mg/dL — AB (ref 65–99)
Potassium: 3.9 mmol/L (ref 3.5–5.3)
Sodium: 136 mmol/L (ref 135–146)
Total Protein: 5.8 g/dL — ABNORMAL LOW (ref 6.1–8.1)

## 2015-07-04 ENCOUNTER — Ambulatory Visit (INDEPENDENT_AMBULATORY_CARE_PROVIDER_SITE_OTHER): Payer: BC Managed Care – PPO | Admitting: Physician Assistant

## 2015-07-04 ENCOUNTER — Encounter: Payer: Self-pay | Admitting: Family Medicine

## 2015-07-04 VITALS — BP 140/88 | HR 135 | Temp 97.5°F | Resp 22 | Ht 78.0 in | Wt >= 6400 oz

## 2015-07-04 DIAGNOSIS — R5383 Other fatigue: Secondary | ICD-10-CM

## 2015-07-04 DIAGNOSIS — R29898 Other symptoms and signs involving the musculoskeletal system: Secondary | ICD-10-CM

## 2015-07-04 DIAGNOSIS — F39 Unspecified mood [affective] disorder: Secondary | ICD-10-CM | POA: Insufficient documentation

## 2015-07-04 DIAGNOSIS — F411 Generalized anxiety disorder: Secondary | ICD-10-CM | POA: Diagnosis not present

## 2015-07-04 NOTE — Progress Notes (Signed)
Brandon Morales  MRN: [id number] DOB: 03/04/1985  Subjective:  Pt presents to clinic because he while he was getting ready for work today he started to feel numbness and weakness in his left arm and he got really tired and now that he is here the symptoms are improved and almost resolved other than he is fatigued.  Pt has a h/o presumed stroke that was diagnosed on 6/9 at a hospital in [facility].  He was at work on 6/9 when his left arm and leg got weak and he had chest pain and he fell against a wall - he was transported to the ED by EMS from work.  During his 3 days stay at the [facility] he had 2 CTs (Friday and Sunday) and they were both normal. After the 1st CT due to his symptoms and what sounded like a telemedicine conference with a [facility] neurologist he was given what the patient describes as TPA which caused his symptoms to completely resolve. He was discharged from the hospital with instructions to f/u with his PCP.  On 6/13 he saw Dr Yetta Barre his PCP who evaluated him at the time and scheduled a return visit on 6/28 where the patient understands they will discuss an MRI and what sounds like a transesophageal echo but these tests have not yet been ordered.  The patient has felt normal since his symptoms resolved on 6/9 until about 4 hours ago.    He has h/o depressed with mood disorder and some anxiety and sees Dr Claybon Jabs and his next appt with her is 07/25/2015.  He states he has anxiety attacks but he describes them as an adrenalin rush and then a crash - they have not felt like this before.  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   Patient Active Problem List   Diagnosis Date Noted  . Mood disorder (HCC) 07/04/2015  . BMI  50.0-59.9, adult (HCC) 07/20/2011  . Asthma 03/11/2011  . Obesity 03/11/2011    Current Outpatient Prescriptions on File Prior to Visit  Medication Sig Dispense Refill  . albuterol (PROVENTIL HFA;VENTOLIN HFA) 108 (90 BASE) MCG/ACT inhaler Inhale 2 puffs into the lungs every 6 (six) hours as needed. 1 Inhaler 11  . albuterol (PROVENTIL) (2.5 MG/3ML) 0.083% nebulizer solution Take 3 mLs (2.5 mg total) by nebulization every 6 (six) hours as needed. 75 mL 11  . divalproex (DEPAKOTE ER) 500 MG 24 hr tablet Take 500 mg by mouth daily.    . DULERA 100-5 MCG/ACT AERO     . metFORMIN (GLUCOPHAGE) 500 MG tablet Take 1 tablet (500 mg total) by mouth 2 (two) times daily with a meal. 60 tablet 2  . PARoxetine (PAXIL) 20 MG tablet Take 20 mg by mouth daily.    . valACYclovir (VALTREX) 1000 MG tablet Take 2 tablets (2,000 mg total) by mouth 2 (two) times daily. (Patient taking differently: Take 1,000 mg by mouth daily. ) 4 tablet 2   No current facility-administered medications on file prior to visit.    Allergies  Allergen Reactions  . Erythromycin     Hives, itching   . Zithromax [Azithromycin Dihydrate]     unknown  Review of Systems  Constitutional: Positive for fatigue. Negative for fever and chills.  Neurological: Positive for weakness. Negative for dizziness and speech difficulty.   Objective:  BP 140/88 mmHg  Pulse 135  Temp(Src) 97.5 F (36.4 C) (Oral)  Resp 22  Ht 6\' 6"  (1.981 m)  Wt 447 lb 12.8 oz (203.121 kg)  BMI 51.76 kg/m2  SpO2 97%  Physical Exam  Constitutional: He is oriented to person, place, and time and well-developed, well-nourished, and in no distress.  HENT:  Head: Normocephalic and atraumatic.  Right Ear: External ear normal.  Left Ear: External ear normal.  Eyes: Conjunctivae are normal.  Neck: Normal range of motion.  Cardiovascular: Normal rate, regular rhythm and normal heart sounds.   No murmur heard. Pulmonary/Chest: Effort normal and breath  sounds normal. He has no wheezes.  Neurological: He is alert and oriented to person, place, and time. He has normal motor skills, normal sensation, normal strength and normal reflexes. He is not agitated and not disoriented. He displays no weakness, facial symmetry, normal speech and normal reflexes. No cranial nerve deficit or sensory deficit. He exhibits normal muscle tone. Gait normal. Coordination and gait normal.  Skin: Skin is warm and dry.  Psychiatric: Mood, memory, affect and judgment normal.    Assessment and Plan :  Weakness of left upper extremity  Other fatigue   Pt has a normal exam in the office. His symptoms lasted about 4 hours and then resolved with only residual fatigue which he has had since his 6/9 hospitalization.  Pt has a PCP and he will call tomorrow and be seen because I do not think that he needs to be seen emergently.  He needs a MRI and work-up as planned with his PCP.  He will go to the hospital tonight if his symptoms return.  We dicussed possible TIA vs stroke (though no deficits and resolve quickly) - if his work-up is normal anxiety has to be considered and we discussed that tonight and he thinks that is a possibility.  His psychiatric f/u is 7/10 and by that time his work-up for stroke hopefully will be done.  He agrees and understands with the plan - his mother is with his tonight and her questions were answered and he also understand and agreed with the plan.  Benny LennertSarah Chivas Notz PA-C  [facility] 07/04/2015 7:18 PM

## 2015-07-08 ENCOUNTER — Ambulatory Visit (INDEPENDENT_AMBULATORY_CARE_PROVIDER_SITE_OTHER): Payer: BC Managed Care – PPO | Admitting: Physician Assistant

## 2015-07-08 VITALS — BP 136/80 | HR 127 | Temp 98.8°F | Resp 18 | Ht 75.0 in | Wt >= 6400 oz

## 2015-07-08 DIAGNOSIS — G4710 Hypersomnia, unspecified: Secondary | ICD-10-CM | POA: Diagnosis not present

## 2015-07-08 DIAGNOSIS — F410 Panic disorder [episodic paroxysmal anxiety] without agoraphobia: Secondary | ICD-10-CM

## 2015-07-08 NOTE — Progress Notes (Signed)
Brandon JacksBenjamin Morales  MRN: [id number] DOB: 12/19/1985  Subjective:  Pt presents to clinic with mom for continuing vague all over body complaints - dizzy, lighthead, tingling, headaches, nervous and chest pain.  These symptoms seem to happen after a stressful event most of the time related to work concerns.  He did not f/u with Dr Yetta BarreJones on 6/20 as we discussed at his 6/19 visit.  He plans to call Dr Claybon JabsHurst and get a sooner appt than 7/10 as he wants to continue his psychiatric treatment with her.  He has nightmares that almost always involve work. His parents divorced at 89 years old and he had a really hard time with that. Mom stated ADD and ODD in childhood. Good work-ethic - wants to work but getting scared about his current job.   His plan is to make it to Nov because then he will have been there for 10 years and he gets insurance for the rest of his life - he wants to work through that date and then stop - he stats he has 93 days of sick leave - he would like to have some time off because he does not think that he can work there currently.    Patient Active Problem List   Diagnosis Date Noted  . Mood disorder (HCC) 07/04/2015  . Anxiety state 07/04/2015  . BMI 50.0-59.9, adult (HCC) 07/20/2011  . Asthma 03/11/2011  . Obesity 03/11/2011    Current Outpatient Prescriptions on File Prior to Visit  Medication Sig Dispense Refill  . albuterol (PROVENTIL HFA;VENTOLIN HFA) 108 (90 BASE) MCG/ACT inhaler Inhale 2 puffs into the lungs every 6 (six) hours as needed. 1 Inhaler 11  . albuterol (PROVENTIL) (2.5 MG/3ML) 0.083% nebulizer solution Take 3 mLs (2.5 mg total) by nebulization every 6 (six) hours as needed. 75 mL 11  . ALPRAZolam (XANAX) 0.5 MG tablet Take 0.5 mg by mouth 2 (two) times daily.    Marland Kitchen. atorvastatin (LIPITOR) 80 MG tablet     . divalproex (DEPAKOTE ER) 500 MG 24 hr tablet Take 500 mg by mouth daily.    . DULERA 100-5 MCG/ACT AERO     . metFORMIN (GLUCOPHAGE) 500 MG tablet Take  1 tablet (500 mg total) by mouth 2 (two) times daily with a meal. 60 tablet 2  . PARoxetine (PAXIL) 20 MG tablet Take 20 mg by mouth daily.    . valACYclovir (VALTREX) 1000 MG tablet Take 2 tablets (2,000 mg total) by mouth 2 (two) times daily. (Patient taking differently: Take 1,000 mg by mouth daily. ) 4 tablet 2   No current facility-administered medications on file prior to visit.    Allergies  Allergen Reactions  . Erythromycin     Hives, itching   . Zithromax [Azithromycin Dihydrate]     unknown    Review of Systems  Psychiatric/Behavioral: Positive for sleep disturbance (hypersomnolence) and decreased concentration. The patient is nervous/anxious.    Objective:  BP 136/80 mmHg  Pulse 127  Temp(Src) 98.8 F (37.1 C) (Oral)  Resp 18  Ht 6\' 3"  (1.905 m)  Wt 456 lb (206.84 kg)  BMI 57.00 kg/m2  SpO2 97%  Physical Exam  Constitutional: He is oriented to person, place, and time and well-developed, well-nourished, and in no distress.  HENT:  Head: Normocephalic and atraumatic.  Right Ear: External ear normal.  Left Ear: External ear normal.  Eyes: Conjunctivae are normal.  Neck: Normal range of motion.  Pulmonary/Chest: Effort normal.  Neurological: He is alert and  oriented to person, place, and time. Gait normal.  Skin: Skin is warm and dry.  Psychiatric: Mood, memory, affect and judgment normal.  Appears anxious when talking about work, flat affect    Spent 30 mins with the patient with >50% spent in counseling Assessment and Plan :  Severe anxiety with panic  Hypersomnolence   D/w pt that his symptoms are consistent with panic attacks.  He was encouraged to f/u with Dr Yetta BarreJones as I think it is important to r/o stroke though I suspect that the patient had a panic attack.  He was encouraged to increase his Paxil to 40mg  prior to his appt with Dr Claybon JabsHurst as I expect her to do this as well.  I gave him a note through 6/30 to be out of work and I believe that he needs an  extended period of time off work to help with his current symptoms.  Benny LennertSarah Weber PA-C  [facility] 07/08/2015 8:14 PM

## 2015-07-08 NOTE — Patient Instructions (Signed)
     IF you received an x-ray today, you will receive an invoice from [facility]. Please contact [facility] at [phone] with questions or concerns regarding your invoice.   IF you received labwork today, you will receive an invoice from [facility]. Please contact [facility] at [phone] with questions or concerns regarding your invoice.   Our billing staff will not be able to assist you with questions regarding bills from these companies.  You will be contacted with the lab results as soon as they are available. The fastest way to get your results is to activate your My Chart account. Instructions are located on the last page of this paperwork. If you have not heard from us regarding the results in 2 weeks, please contact this office.      

## 2015-07-13 NOTE — Progress Notes (Addendum)
Cardiology Office Note   Date:  07/14/2015   ID:  Brandon Morales, DOB 08/19/1985, MRN [id number]  PCP:  Paulino RilyJONES,ENRICO G, MD  Cardiologist:   Brandon RotundaJames Chandra Asher, MD   No chief complaint on file.     History of Present Illness: Brandon Morales is a 30 y.o. male who presents for evaluation of a TIA. He said he had this he reports an episode of weakness of his left arm and left leg about 4 weeks ago.  He was hospitalized at [place]. He describes having had CTs, carotid and an echocardiogram. I don't have these records. He was treated with aspirin. There was no clear etiology. He was sent here for further workup. He's not had prior cardiac workup. He doesn't describe any overt cardiovascular symptoms. He will occasionally get some pain under his left breast. He does have some snoring. He's had an occasional cough and headache. He doesn't describe any palpitations, presyncope or syncope. He does not describe PND or orthopnea. He's had no weight gain more than usual or edema. 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  Past Medical History  Diagnosis Date  . Asthma   . Anxiety   . Hyperglycemia     Past Surgical History  Procedure Laterality Date  . Tonsillectomy       Current Outpatient Prescriptions  Medication Sig Dispense Refill  . albuterol (PROVENTIL HFA;VENTOLIN HFA) 108 (90 BASE) MCG/ACT inhaler Inhale 2 puffs into the lungs every 6 (six) hours as needed. 1 Inhaler 11  . albuterol (PROVENTIL) (2.5 MG/3ML) 0.083% nebulizer solution Take 3 mLs (2.5 mg total) by nebulization every 6 (six) hours as needed. 75 mL 11  . ALPRAZolam (XANAX) 0.5 MG tablet Take 0.5 mg by mouth 2 (two) times daily.    Marland Kitchen. atorvastatin (LIPITOR) 80 MG tablet     . divalproex (DEPAKOTE ER) 500 MG 24 hr tablet Take 500 mg by mouth daily.    . DULERA 100-5 MCG/ACT AERO     . metFORMIN (GLUCOPHAGE) 500 MG tablet Take 1 tablet (500 mg total) by mouth  2 (two) times daily with a meal. 60 tablet 2  . PARoxetine (PAXIL) 20 MG tablet Take 20 mg by mouth daily.     No current facility-administered medications for this visit.    Allergies:   Erythromycin and Zithromax    Social History:  The patient  reports that he quit smoking about 7 years ago. He does not have any smokeless tobacco history on file. He reports that he does not drink alcohol or use illicit drugs.   Family History:  The patient's family history includes Diabetes in his maternal grandmother and mother; Hypertension in his mother.    ROS:  Please see the history of present illness.   Otherwise, review of systems are positive for none.   All other systems are reviewed and negative.    PHYSICAL EXAM: VS:  BP 128/85 mmHg  Pulse 114  Ht 6\' 3"  (1.905 m)  Wt 454 lb (205.933 kg)  BMI 56.75 kg/m2 , BMI Body mass index is 56.75 kg/(m^2). GENERAL:  Well appearing HEENT:  Pupils equal round and reactive, fundi not visualized, oral mucosa unremarkable NECK:  No jugular venous distention, waveform within normal limits, carotid upstroke brisk and symmetric, no bruits, no thyromegaly LYMPHATICS:  No cervical, inguinal adenopathy LUNGS:  Clear to auscultation bilaterally BACK:  No CVA tenderness CHEST:  Unremarkable HEART:  PMI not displaced or sustained,S1 and S2 within normal limits, no S3, no S4, no clicks, no rubs, no  murmurs ABD:  Flat, positive bowel sounds normal in frequency in pitch, no bruits, no rebound, no guarding, no midline pulsatile mass, no hepatomegaly, no splenomegaly EXT:  2 plus pulses throughout, no edema, no cyanosis no clubbing SKIN:  No rashes no nodules NEURO:  Cranial nerves II through XII grossly intact, motor grossly intact throughout PSYCH:  Cognitively intact, oriented to person place and time    EKG:  EKG is ordered today. The ekg ordered today demonstratessinus tachycardia, rate 114, axis rightward, intervals within normal limits, no acute ST-T  wave changes.   Recent Labs: 06/07/2015: Platelets 213 06/20/2015: ALT 28; BUN 18; Creat 0.88; Hemoglobin 12.7*; Potassium 3.9; Sodium 136    Lipid Panel No results found for: CHOL, TRIG, HDL, CHOLHDL, VLDL, LDLCALC, LDLDIRECT    Wt Readings from Last 3 Encounters:  07/14/15 454 lb (205.933 kg)  07/08/15 456 lb (206.84 kg)  07/04/15 447 lb 12.8 oz (203.121 kg)      Other studies Reviewed: Additional studies/ records that were reviewed today include: . Review of the above records demonstrates:  Please see elsewhere in the note.     ASSESSMENT AND PLAN:  TIA:  I don't see a clear etiology but I need to see the records from [place]. We'll need to make sure he doesn't have atrial fibrillation although I don't strongly suspect this. Symptoms would not be frequent and not to trust a Holter. Brandon Morales will need a 21 day event monitor.  The patients symptoms necessitate an event monitor.  The symptoms are too infrequent to be identified on a Holter monitor.    OBESITY:  The patient understands the need to lose weight with diet and exercise. We have discussed specific strategies for this.      Current medicines are reviewed at length with the patient today.  The patient does not have concerns regarding medicines.  The following changes have been made:  no change  Labs/ tests ordered today include:   Orders Placed This Encounter  Procedures  . Cardiac event monitor  . EKG 12-Lead     Disposition:   FU with me as needed.      Signed, Brandon RotundaJames Chade Pitner, MD  07/14/2015 8:49 AM    [facility]   Addendum:  The patient left I was able to get results from the outside hospital. Interestingly he was given thrombolytics for CVA when he went to the emergency room although he had no findings of abnormality on the CT. He couldn't get an MRI because of his body size. Echo was low quality. It was suggested that he have a TEE with bubble. Also been monitor was  requested. We will go ahead and arrange for TEE with bubble study.

## 2015-07-14 ENCOUNTER — Encounter: Payer: Self-pay | Admitting: Cardiology

## 2015-07-14 ENCOUNTER — Telehealth: Payer: Self-pay | Admitting: Cardiology

## 2015-07-14 ENCOUNTER — Ambulatory Visit (INDEPENDENT_AMBULATORY_CARE_PROVIDER_SITE_OTHER): Payer: BC Managed Care – PPO | Admitting: Cardiology

## 2015-07-14 ENCOUNTER — Encounter (INDEPENDENT_AMBULATORY_CARE_PROVIDER_SITE_OTHER): Payer: BC Managed Care – PPO

## 2015-07-14 VITALS — BP 128/85 | HR 114 | Ht 75.0 in | Wt >= 6400 oz

## 2015-07-14 DIAGNOSIS — G459 Transient cerebral ischemic attack, unspecified: Secondary | ICD-10-CM | POA: Diagnosis not present

## 2015-07-14 DIAGNOSIS — R000 Tachycardia, unspecified: Secondary | ICD-10-CM

## 2015-07-14 NOTE — Telephone Encounter (Signed)
Received records from [facility] as requested by Dr Antoine PocheHochrein.  Records given to Dr Antoine PocheHochrein to review. lp

## 2015-07-14 NOTE — Patient Instructions (Signed)
Your physician recommends that you schedule a follow-up appointment in: As Needed.  Your physician has recommended that you wear an event monitor. Event monitors are medical devices that record the heart's electrical activity. Doctors most often us these monitors to diagnose arrhythmias. Arrhythmias are problems with the speed or rhythm of the heartbeat. The monitor is a small, portable device. You can wear one while you do your normal daily activities. This is usually used to diagnose what is causing palpitations/syncope (passing out).

## 2015-07-14 NOTE — Telephone Encounter (Signed)
Faxed Release signed by patient to [facility] to obtain records per Dr Antoine PocheHochrein.  Faxed to [phone].

## 2015-07-15 ENCOUNTER — Other Ambulatory Visit: Payer: Self-pay | Admitting: *Deleted

## 2015-07-15 DIAGNOSIS — G459 Transient cerebral ischemic attack, unspecified: Secondary | ICD-10-CM

## 2015-07-20 ENCOUNTER — Encounter: Payer: Self-pay | Admitting: Neurology

## 2015-07-20 ENCOUNTER — Encounter: Payer: Self-pay | Admitting: Internal Medicine

## 2015-07-20 ENCOUNTER — Ambulatory Visit (INDEPENDENT_AMBULATORY_CARE_PROVIDER_SITE_OTHER): Payer: BC Managed Care – PPO | Admitting: Neurology

## 2015-07-20 ENCOUNTER — Other Ambulatory Visit: Payer: Self-pay | Admitting: *Deleted

## 2015-07-20 VITALS — BP 130/82 | HR 64 | Ht 75.0 in | Wt >= 6400 oz

## 2015-07-20 DIAGNOSIS — G4730 Sleep apnea, unspecified: Secondary | ICD-10-CM

## 2015-07-20 DIAGNOSIS — D689 Coagulation defect, unspecified: Secondary | ICD-10-CM

## 2015-07-20 DIAGNOSIS — R5383 Other fatigue: Secondary | ICD-10-CM

## 2015-07-20 DIAGNOSIS — G4710 Hypersomnia, unspecified: Secondary | ICD-10-CM

## 2015-07-20 DIAGNOSIS — G451 Carotid artery syndrome (hemispheric): Secondary | ICD-10-CM | POA: Diagnosis not present

## 2015-07-20 DIAGNOSIS — G459 Transient cerebral ischemic attack, unspecified: Secondary | ICD-10-CM

## 2015-07-20 DIAGNOSIS — Z01818 Encounter for other preprocedural examination: Secondary | ICD-10-CM

## 2015-07-20 HISTORY — DX: Transient cerebral ischemic attack, unspecified: G45.9

## 2015-07-20 NOTE — Progress Notes (Signed)
Reason for visit: TIA/stroke  Referring physician: Dr. Sherre ScarletJones  Brandon Morales is a 30 y.o. male  History of present illness:  Brandon Morales is a 30 year old right-handed white male with a history of depression, mood disturbance on Depakote. The patient was seen in [place], [place] in the emergency room for onset of left-sided chest pain. The patient eventually felt as if he was going to collapse, then he fell down into a chair. The patient went to the hospital, he was found to have left-sided weakness involving the arm and the leg, unable to stand or walk. He felt somewhat tingly on the left side of the face, he denied any headache, vision disturbance, or definite slurring of speech. He felt somewhat confused. He underwent a CT scan of the head that was unremarkable, TPA was administered and he indicated that he felt much better shortly after the TPA was started. He felt that the symptoms lasted 12-14 hours and then cleared completely. He has not had any residual complaints. The patient has undergone a 2-D echocardiogram that apparently was technically difficult, he has a heart monitor on currently. Carotid Doppler studies were unremarkable and venous Doppler studies of the legs were unremarkable. The patient is been placed on low-dose aspirin. A lupus and anticoagulant antibody was negative. The patient does report excessive daytime drowsiness, snoring at night, he has not had a sleep study previously. He denies any episodes similar to what is described above. He is sent to this office for an evaluation.  Past Medical History  Diagnosis Date  . Asthma   . Anxiety   . Hyperglycemia   . TIA (transient ischemic attack) 07/20/2015    Past Surgical History  Procedure Laterality Date  . Tonsillectomy      Family History  Problem Relation Age of Onset  . Diabetes Mother   . Hypertension Mother   . Diabetes Maternal Grandmother     Social history:  reports that he quit smoking about  7 years ago. He does not have any smokeless tobacco history on file. He reports that he does not drink alcohol or use illicit drugs.  Medications:  Prior to Admission medications   Medication Sig Start Date End Date Taking? Authorizing Provider  albuterol (PROVENTIL HFA;VENTOLIN HFA) 108 (90 BASE) MCG/ACT inhaler Inhale 2 puffs into the lungs every 6 (six) hours as needed. 07/20/11  Yes Tonye Pearsonobert P Doolittle, MD  albuterol (PROVENTIL) (2.5 MG/3ML) 0.083% nebulizer solution Take 3 mLs (2.5 mg total) by nebulization every 6 (six) hours as needed. 07/20/11  Yes Tonye Pearsonobert P Doolittle, MD  ALPRAZolam Prudy Feeler(XANAX) 0.5 MG tablet Take 0.5 mg by mouth 2 (two) times daily.   Yes Historical Provider, MD  aspirin 81 MG tablet Take 81 mg by mouth daily.   Yes Historical Provider, MD  atorvastatin (LIPITOR) 80 MG tablet  06/29/15  Yes Historical Provider, MD  divalproex (DEPAKOTE ER) 500 MG 24 hr tablet Take 500 mg by mouth daily.   Yes Historical Provider, MD  DULERA 100-5 MCG/ACT AERO  06/15/15  Yes Historical Provider, MD  metFORMIN (GLUCOPHAGE) 500 MG tablet Take 1 tablet (500 mg total) by mouth 2 (two) times daily with a meal. 06/20/15  Yes Ethelda ChickKristi M Smith, MD  PARoxetine (PAXIL) 20 MG tablet Take 20 mg by mouth daily.   Yes Historical Provider, MD      Allergies  Allergen Reactions  . Erythromycin     Hives, itching   . Zithromax [Azithromycin Dihydrate]     unknown  ROS:  Out of a complete 14 system review of symptoms, the patient complains only of the following symptoms, and all other reviewed systems are negative.  Ringing in the ears Cough, snoring Achy muscles Depression, too much sleep, decreased energy, disinterest in activities Shift work  Blood pressure 130/82, pulse 64, height 6\' 3"  (1.905 m), weight 452 lb (205.026 kg).  Physical Exam  General: The patient is alert and cooperative at the time of the examination. The patient is markedly obese.  Eyes: Pupils are equal, round, and reactive to  light. Discs are flat bilaterally.  Neck: The neck is supple, no carotid bruits are noted.  Respiratory: The respiratory examination is clear.  Cardiovascular: The cardiovascular examination reveals a regular rate and rhythm, no obvious murmurs or rubs are noted.  Skin: Extremities are with 1+ edema below the knees bilaterally.  Neurologic Exam  Mental status: The patient is alert and oriented x 3 at the time of the examination. The patient has apparent normal recent and remote memory, with an apparently normal attention span and concentration ability.  Cranial nerves: Facial symmetry is present. There is good sensation of the face to pinprick and soft touch bilaterally. The strength of the facial muscles and the muscles to head turning and shoulder shrug are normal bilaterally. Speech is well enunciated, no aphasia or dysarthria is noted. Extraocular movements are full. Visual fields are full. The tongue is midline, and the patient has symmetric elevation of the soft palate. No obvious hearing deficits are noted.  Motor: The motor testing reveals 5 over 5 strength of all 4 extremities. Good symmetric motor tone is noted throughout.  Sensory: Sensory testing is intact to pinprick, soft touch, vibration sensation, and position sense on all 4 extremities. No evidence of extinction is noted.  Coordination: Cerebellar testing reveals good finger-nose-finger and heel-to-shin bilaterally.  Gait and station: Gait is normal. Tandem gait is normal. Romberg is negative. No drift is seen.  Reflexes: Deep tendon reflexes are symmetric and normal bilaterally. Toes are downgoing bilaterally.   Assessment/Plan:  1. Clinical TIA event, left-sided weakness  2. Morbid obesity  3. Excessive daytime drowsiness  The patient has had a clinical event associated with left-sided symptoms. He has not undergone MRI of the brain secondary to his weight, we will refer him for MRI as an outpatient. The patient  will remain on aspirin. He will have a referral for a sleep evaluation. Sleep apnea is an independent risk factor for stroke. He will follow-up through this office if needed.  Marlan Palau. Keith Elinor Kleine MD 07/20/2015 4:45 PM  [facility] [address] [place], [place] [id number]  Phone [phone] Fax [phone]

## 2015-07-20 NOTE — Patient Instructions (Signed)
Stroke Prevention Some medical conditions and behaviors are associated with an increased chance of having a stroke. You may prevent a stroke by making healthy choices and managing medical conditions. HOW CAN I REDUCE MY RISK OF HAVING A STROKE?   Stay physically active. Get at least 30 minutes of activity on most or all days.  Do not smoke. It may also be helpful to avoid exposure to secondhand smoke.  Limit alcohol use. Moderate alcohol use is considered to be:  No more than 2 drinks per day for men.  No more than 1 drink per day for nonpregnant women.  Eat healthy foods. This involves:  Eating 5 or more servings of fruits and vegetables a day.  Making dietary changes that address high blood pressure (hypertension), high cholesterol, diabetes, or obesity.  Manage your cholesterol levels.  Making food choices that are high in fiber and low in saturated fat, trans fat, and cholesterol may control cholesterol levels.  Take any prescribed medicines to control cholesterol as directed by your health care provider.  Manage your diabetes.  Controlling your carbohydrate and sugar intake is recommended to manage diabetes.  Take any prescribed medicines to control diabetes as directed by your health care provider.  Control your hypertension.  Making food choices that are low in salt (sodium), saturated fat, trans fat, and cholesterol is recommended to manage hypertension.  Ask your health care provider if you need treatment to lower your blood pressure. Take any prescribed medicines to control hypertension as directed by your health care provider.  If you are 18-39 years of age, have your blood pressure checked every 3-5 years. If you are 40 years of age or older, have your blood pressure checked every year.  Maintain a healthy weight.  Reducing calorie intake and making food choices that are low in sodium, saturated fat, trans fat, and cholesterol are recommended to manage  weight.  Stop drug abuse.  Avoid taking birth control pills.  Talk to your health care provider about the risks of taking birth control pills if you are over 35 years old, smoke, get migraines, or have ever had a blood clot.  Get evaluated for sleep disorders (sleep apnea).  Talk to your health care provider about getting a sleep evaluation if you snore a lot or have excessive sleepiness.  Take medicines only as directed by your health care provider.  For some people, aspirin or blood thinners (anticoagulants) are helpful in reducing the risk of forming abnormal blood clots that can lead to stroke. If you have the irregular heart rhythm of atrial fibrillation, you should be on a blood thinner unless there is a good reason you cannot take them.  Understand all your medicine instructions.  Make sure that other conditions (such as anemia or atherosclerosis) are addressed. SEEK IMMEDIATE MEDICAL CARE IF:   You have sudden weakness or numbness of the face, arm, or leg, especially on one side of the body.  Your face or eyelid droops to one side.  You have sudden confusion.  You have trouble speaking (aphasia) or understanding.  You have sudden trouble seeing in one or both eyes.  You have sudden trouble walking.  You have dizziness.  You have a loss of balance or coordination.  You have a sudden, severe headache with no known cause.  You have new chest pain or an irregular heartbeat. Any of these symptoms may represent a serious problem that is an emergency. Do not wait to see if the symptoms will   go away. Get medical help at once. Call your local emergency services (911 in [place] Do not drive yourself to the hospital.   This information is not intended to replace advice given to you by your health care provider. Make sure you discuss any questions you have with your health care provider.   Document Released: 02/09/2004 Document Revised: 01/22/2014 Document Reviewed:  07/04/2012 Elsevier Interactive Patient Education 2016 [employer]  

## 2015-07-26 ENCOUNTER — Other Ambulatory Visit (HOSPITAL_COMMUNITY): Payer: BC Managed Care – PPO | Attending: Psychiatry | Admitting: Psychiatry

## 2015-07-26 ENCOUNTER — Encounter (HOSPITAL_COMMUNITY): Payer: Self-pay | Admitting: Psychiatry

## 2015-07-26 DIAGNOSIS — Z87891 Personal history of nicotine dependence: Secondary | ICD-10-CM | POA: Insufficient documentation

## 2015-07-26 DIAGNOSIS — Z7982 Long term (current) use of aspirin: Secondary | ICD-10-CM | POA: Insufficient documentation

## 2015-07-26 DIAGNOSIS — F349 Persistent mood [affective] disorder, unspecified: Secondary | ICD-10-CM | POA: Insufficient documentation

## 2015-07-26 DIAGNOSIS — F411 Generalized anxiety disorder: Secondary | ICD-10-CM

## 2015-07-26 DIAGNOSIS — Z8673 Personal history of transient ischemic attack (TIA), and cerebral infarction without residual deficits: Secondary | ICD-10-CM | POA: Insufficient documentation

## 2015-07-26 LAB — APTT: APTT: 29 s (ref 22–34)

## 2015-07-26 LAB — BASIC METABOLIC PANEL
BUN: 15 mg/dL (ref 7–25)
CHLORIDE: 101 mmol/L (ref 98–110)
CO2: 24 mmol/L (ref 20–31)
Calcium: 8.4 mg/dL — ABNORMAL LOW (ref 8.6–10.3)
Creat: 0.81 mg/dL (ref 0.60–1.35)
GLUCOSE: 225 mg/dL — AB (ref 65–99)
POTASSIUM: 4 mmol/L (ref 3.5–5.3)
Sodium: 137 mmol/L (ref 135–146)

## 2015-07-26 LAB — TSH: TSH: 3.04 mIU/L (ref 0.40–4.50)

## 2015-07-26 LAB — CBC
HCT: 40 % (ref 38.5–50.0)
Hemoglobin: 13.1 g/dL — ABNORMAL LOW (ref 13.2–17.1)
MCH: 27.7 pg (ref 27.0–33.0)
MCHC: 32.8 g/dL (ref 32.0–36.0)
MCV: 84.6 fL (ref 80.0–100.0)
MPV: 9.7 fL (ref 7.5–12.5)
Platelets: 196 10*3/uL (ref 140–400)
RBC: 4.73 MIL/uL (ref 4.20–5.80)
RDW: 14.6 % (ref 11.0–15.0)
WBC: 5.7 10*3/uL (ref 3.8–10.8)

## 2015-07-26 LAB — PROTIME-INR
INR: 1
Prothrombin Time: 10.7 s (ref 9.0–11.5)

## 2015-07-26 NOTE — Progress Notes (Signed)
Psychiatric Initial Adult Assessment   Patient Identification: Brandon Morales MRN:  [id number] Date of Evaluation:  07/26/2015 Referral Source: Lowell Guitar PA Chief Complaint: anxiety to the point of being unable to leave the house most days.  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  Says he and a couple other guards followed procedures and regulations and the others you could not count on to do their jobs and especially could not count on them to back you up in encounters with the inmates.  There were 2 recent accounts of prisoners being killed by inmates in the news and that bothered him a lot as he felt unsafe on his job and also that the administrators would not be supportive if something happened.  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  He has reached the point that he cannot leave the house most days even for a short trip with his mother in the car.  He spends his time in his room.  He panics outside home.  Recent medications prescribed by his provider have helped enough to get him here.  He has been having nightmares, sleeping all the time, needing his mother to be with him at all times, increased anger, frustration and sadness.he is hoping he can last on the job one way or the other till he is invested in Training and development officer which happens in November.    Visit Diagnosis:    ICD-9-CM ICD-10-CM   1. Generalized anxiety disorder 300.02 F41.1   2. 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  Outpatient treatment recently during the current episode  Previous Psychotropic Medications: Yes   Substance Abuse History in the last 12 months:  No.  Consequences of Substance Abuse: Negative  Past Medical History:  Past Medical History  Diagnosis Date  . Asthma   . Anxiety   . Hyperglycemia   . TIA (transient ischemic attack) 07/20/2015    Past Surgical History  Procedure Laterality Date  . Tonsillectomy      Family Psychiatric History: mother and maternal grandparents have diagnosis  Family History:  Family History  Problem Relation Age of Onset  . Diabetes Mother   . Hypertension Mother   . Diabetes Maternal Grandmother     Social History:   Social History   Social History  . Marital Status: Single    Spouse Name: N/A  . Number of Children: 0  . Years of Education: Some coll   Occupational History  . Corporate treasurer    Social History Main Topics  . Smoking status: Former Smoker    Quit date: 03/10/2008  . Smokeless tobacco: None  . Alcohol Use: No  . Drug Use: No  . Sexual Activity: Not Asked   Other Topics Concern  . None   Social History Narrative   Mother lives with the patient.    Right-handed   Drinks about 4 cups of coffee or tea per day    Additional Social History: had learning differences as a child and ADHD.  Was in special  classes and schools.  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 Erythromycin     Hives, itching   . Zithromax [Azithromycin Dihydrate]     unknown    Metabolic Disorder Labs: Lab Results  Component Value Date   HGBA1C 7.3 06/20/2015   No results found for: PROLACTIN No results found for: CHOL, TRIG, HDL, CHOLHDL, VLDL, LDLCALC   Current Medications: Current Outpatient Prescriptions  Medication Sig Dispense Refill  . albuterol  (PROVENTIL HFA;VENTOLIN HFA) 108 (90 BASE) MCG/ACT inhaler Inhale 2 puffs into the lungs every 6 (six) hours as needed. 1 Inhaler 11  . albuterol (PROVENTIL) (2.5 MG/3ML) 0.083% nebulizer solution Take 3 mLs (2.5 mg total) by nebulization every 6 (six) hours as needed. 75 mL 11  . ALPRAZolam (XANAX) 0.5 MG tablet Take 0.5 mg by mouth 2 (two) times daily.    Marland Kitchen. aspirin 81 MG tablet Take 81 mg by mouth daily.    Marland Kitchen. atorvastatin (LIPITOR) 80 MG tablet     . divalproex (DEPAKOTE ER) 500 MG 24 hr tablet Take 500 mg by mouth daily.    . DULERA 100-5 MCG/ACT AERO     . metFORMIN (GLUCOPHAGE) 500 MG tablet Take 1 tablet (500 mg total) by mouth 2 (two) times daily with a meal. 60 tablet 2  . PARoxetine (PAXIL) 20 MG tablet Take 20 mg by mouth daily.     No current facility-administered medications for this visit.    Neurologic: Headache: Negative Seizure: Negative Paresthesias:Negative  Musculoskeletal: Strength & Muscle Tone: within normal limits Gait & Station: normal Patient leans: N/A  Psychiatric Specialty Exam: ROS  There were no vitals taken for this visit.There is no weight on file to calculate BMI.  General Appearance: Casual  Eye Contact:  Good  Speech:  Clear and Coherent  Volume:  Normal  Mood:  Anxious  Affect:  Congruent  Thought Process:  Coherent and Goal Directed  Orientation:  Full (Time, Place, and Person)  Thought Content:  Logical  Suicidal Thoughts:  No  Homicidal Thoughts:  No  Memory:  Immediate;   Good Recent;   Good Remote;   Good  Judgement:  Intact  Insight:  Fair  Psychomotor Activity:  Normal  Concentration:  Concentration: Good and Attention Span: Good  Recall:  Good  Fund of Knowledge:Good  Language: Good  Akathisia:  Negative  Handed:  Right  AIMS (if indicated):  0  Assets:  Communication Skills Desire for Improvement Financial Resources/Insurance Housing Leisure Time Physical Health Resilience Social  Support Talents/Skills Transportation Vocational/Educational  ADL's:  Intact  Cognition: WNL  Sleep:  Too much    Treatment Plan Summary: daily group therapy   Carolanne GrumblingGerald Taylor, MD 7/11/20171:16 PM

## 2015-07-26 NOTE — Progress Notes (Signed)
Comprehensive Clinical Assessment (CCA) Note  07/26/2015 [name] [id number]  Visit Diagnosis:      ICD-9-CM ICD-10-CM   1. Generalized anxiety disorder 300.02 F41.1   2. 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  (See scanned document in Chart Review)  CCA Part Two A  Intake/Chief Complaint:  CCA Intake With Chief Complaint CCA Part Two Date: 07/26/15 CCA Part Two Time: 1537 Chief Complaint/Presenting Problem: This is a 30 yr old single, Caucasian male; who was referred per Melony Overly, PA-C; treatment for worsening anxiety and depressive symptoms.  Pt c/o of anxiety to the point of not being able to leave his home.  Denies SI/HI or A/V hallucinations.  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  According to pt, the job became more and more stressful three summers ago, whenever a new lieutentant was hired.  Pt reports that he and a few other guards would follow procedures and regulations, but the others wouldn't and he couldn't count on them to back him up in encounters with inmates.  "I feel they are trying to get me to quit my job, because I make more money." 2)  Medical Issues:  Asthma and Diabetes.  Reports a recent TIA.  Pt reports one prior psychiatric hospitalization when he was in high school due to anger issues with SI.  Denies any previous suicide attempts.  Has been seeing Melony Overly, PA-C, but is in need of a therapist.  States Rosey Bath recently started him on Depakote and he is tolerating it well.  Family Hx:  Mother (Depression).                                    Patients Currently Reported Symptoms/Problems: Sadness, anxious, increased sleep (nightmares at night), no motivation, lack of energy, irritable, indecisiveness, anhedonia, poor appetite  Collateral Involvement: Mother is very supportive.  She accompanied pt  today. Individual's Strengths: Pt is motivated for treatment.  Pt is very attentive.  Mental Health Symptoms Depression:  Depression: Change in energy/activity, Difficulty Concentrating, Increase/decrease in appetite, Irritability, Sleep (too much or little)  Mania:  Mania: N/A  Anxiety:   Anxiety: Tension, Worrying, Irritability  Psychosis:  Psychosis: N/A  Trauma:  Trauma: N/A  Obsessions:  Obsessions: N/A  Compulsions:  Compulsions: N/A  Inattention:  Inattention: N/A  Hyperactivity/Impulsivity:  Hyperactivity/Impulsivity: N/A  Oppositional/Defiant Behaviors:  Oppositional/Defiant Behaviors: N/A  Borderline Personality:  Emotional Irregularity: N/A  Other Mood/Personality Symptoms:      Mental Status Exam Appearance and self-care  Stature:  Stature: Tall  Weight:  Weight: Obese  Clothing:  Clothing: Casual  Grooming:  Grooming: Normal  Cosmetic use:  Cosmetic Use: None  Posture/gait:  Posture/Gait: Normal  Motor activity:  Motor Activity: Not Remarkable  Sensorium  Attention:  Attention: Normal  Concentration:  Concentration: Normal  Orientation:  Orientation: X5  Recall/memory:  Recall/Memory: Normal  Affect and Mood  Affect:     Mood:     Relating  Eye contact:  Eye Contact: Normal  Facial expression:  Facial Expression: Anxious  Attitude toward examiner:  Attitude Toward Examiner: Cooperative  Thought and Language  Speech flow: Speech Flow: Normal  Thought content:  Thought Content: Appropriate to mood and circumstances  Preoccupation:     Hallucinations:  Organization:     Company secretary of Knowledge:  Fund of Knowledge: Average  Intelligence:  Intelligence: Average  Abstraction:  Abstraction: Functional  Judgement:  Judgement: Normal  Reality Testing:  Reality Testing: Adequate  Insight:  Insight: Good  Decision Making:  Decision Making: Normal  Social Functioning  Social Maturity:  Social Maturity: Isolates  Social Judgement:  Social  Judgement: Normal  Stress  Stressors:  Stressors: Work  Coping Ability:  Coping Ability: Building surveyor Deficits:     Supports:      Family and Psychosocial History: Family history Marital status: Single Are you sexually active?: No Does patient have children?: No  Childhood History:  Childhood History By whom was/is the patient raised?: Mother Additional childhood history information: Born in [place], [place].  Was dx'd with ADD/ADHD and LD at age 30.  Parents divorced when pt was age 64.  Pt had a lot of anger issues.  Father sent pt to [employer] due to his anger outbursts.  Afterwards, pt was then sent to a special ed boarding school .  Pt denied any trauma or abuse. Patient's description of current relationship with people who raised him/her: Very close to his mother. 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 [employer] 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 Patient's job has been impacted by current illness: Yes Describe how patient's job has been impacted: increased absences whenever he was working What is the longest time patient has a held a job?: current postion Has patient ever been in the [employer]?: No Has patient ever served in combat?: No Did You Receive Any Psychiatric Treatment/Services While in Equities trader?: No Are There Guns or Other Weapons in Your Home?: No Are These Development worker, community?:  (n/a)  Education: Education Did Garment/textile technologist From [employer]?: Yes Did You Attend College?: Yes What Type of College Degree Do you Have?: ukn Did You Attend Graduate School?: No What Was Your Major?: ukn Did You Have An Individualized Education Program (IIEP): Yes Did You Have Any Difficulty At School?: Yes (Had learning differences as a child and ADHD.  Was in specila classes and schools.) Were Any Medications Ever Prescribed For These Difficulties?: Yes Medications Prescribed For School Difficulties?: ukn  Religion: Religion/Spirituality Are You A Religious Person?: Yes What is Your Religious Affiliation?: Christian How Might This Affect Treatment?: n/a  Leisure/Recreation: Leisure / Recreation Leisure and Hobbies: playing video games  Exercise/Diet: Exercise/Diet Do You Exercise?: No Have You Gained or Lost A Significant Amount of Weight in the Past Six Months?: No Do You Follow a Special Diet?: Yes Type of Diet: Diabetic Do You Have Any Trouble Sleeping?: Yes Explanation of Sleeping Difficulties: No trouble sleeping, but increased sleeping  CCA Part Two C  Alcohol/Drug Use: Alcohol / Drug Use History of alcohol / drug use?: No history of alcohol / drug abuse  CCA Part Three  ASAM's:  Six Dimensions of Multidimensional Assessment  Dimension 1:  Acute Intoxication and/or Withdrawal Potential:     Dimension 2:  Biomedical Conditions and Complications:     Dimension 3:  Emotional, Behavioral, or Cognitive Conditions and Complications:     Dimension 4:  Readiness to Change:     Dimension 5:  Relapse, Continued use, or Continued Problem Potential:     Dimension 6:  Recovery/Living Environment:      Substance use Disorder (SUD)    Social Function:  Social Functioning Social Maturity: Isolates Social Judgement: Normal  Stress:  Stress Stressors: Work Coping Ability: Overwhelmed Patient Takes Medications The Way  The Doctor Instructed?: Yes  Risk Assessment- Self-Harm Potential: Risk Assessment For Self-Harm Potential Thoughts of Self-Harm: No current thoughts Method: No plan Availability of Means: No access/NA  Risk Assessment -Dangerous to Others Potential: Risk Assessment For Dangerous to Others Potential Method: No Plan Availability of Means: No access or NA Intent: Vague intent or NA (n/a) Notification Required:  (n/a)  DSM5 Diagnoses: Patient Active Problem List   Diagnosis Date Noted  . Generalized anxiety disorder 07/26/2015    Class: Chronic  . Persistent mood (affective) disorder, unspecified (HCC) 07/26/2015    Class: Chronic  . TIA (transient ischemic attack) 07/20/2015  . Mood disorder (HCC) 07/04/2015  . Anxiety state 07/04/2015  . BMI 50.0-59.9, adult (HCC) 07/20/2011  . Asthma 03/11/2011  . Obesity 03/11/2011    Patient Centered Plan: Patient is on the following Treatment Plan(s):  Anxiety and Depression  Recommendations for Services/Supports/Treatments: Recommendations for Services/Supports/Treatments Recommendations For Services/Supports/Treatments: IOP (Intensive Outpatient Program)  Treatment Plan Summary:  Pt will attend [facility] for two weeks.  Will participate in group therapy and psycho-educational groups to learn effective coping skills.  Also, to provide pt with a structured environment. F/U with Melony Overlyeresa Hurst, PA-C.  Refer pt to a therapist.  Referrals to Alternative Service(s): Referred to Alternative Service(s):   Place:   Date:   Time:    Referred to Alternative Service(s):   Place:   Date:   Time:    Referred to Alternative Service(s):   Place:   Date:   Time:    Referred to Alternative Service(s):   Place:   Date:   Time:     Nahmir Zeidman, RITA, M.Ed, CNA

## 2015-07-27 ENCOUNTER — Other Ambulatory Visit (HOSPITAL_COMMUNITY): Payer: BC Managed Care – PPO | Admitting: Psychiatry

## 2015-07-27 DIAGNOSIS — F349 Persistent mood [affective] disorder, unspecified: Secondary | ICD-10-CM

## 2015-07-27 DIAGNOSIS — F411 Generalized anxiety disorder: Secondary | ICD-10-CM

## 2015-07-27 DIAGNOSIS — Z8673 Personal history of transient ischemic attack (TIA), and cerebral infarction without residual deficits: Secondary | ICD-10-CM | POA: Diagnosis not present

## 2015-07-27 DIAGNOSIS — Z87891 Personal history of nicotine dependence: Secondary | ICD-10-CM | POA: Diagnosis not present

## 2015-07-27 DIAGNOSIS — Z7982 Long term (current) use of aspirin: Secondary | ICD-10-CM | POA: Diagnosis not present

## 2015-07-27 NOTE — Progress Notes (Signed)
Daily Group Progress Note  Program: IOP   Group Time: 10:45-12:00pm   Participation Level: Minimal   Behavioral Response: Engaged   Type of Therapy:  Group therapy   Summary of Progress: Pt participated in a discussion about communication: listening, hearing, talking and processing. Pt was encouraged to communication effectively, concisely, and confidently.     Vernona RiegerLisbeth S. Mackenzie, LCASA

## 2015-07-28 ENCOUNTER — Ambulatory Visit (HOSPITAL_COMMUNITY): Payer: BC Managed Care – PPO | Admitting: Certified Registered Nurse Anesthetist

## 2015-07-28 ENCOUNTER — Ambulatory Visit (HOSPITAL_COMMUNITY)
Admission: RE | Admit: 2015-07-28 | Discharge: 2015-07-28 | Disposition: A | Discharge status: Home / Self Care | Payer: BC Managed Care – PPO | Source: Ambulatory Visit | Attending: Internal Medicine | Admitting: Internal Medicine

## 2015-07-28 ENCOUNTER — Ambulatory Visit (HOSPITAL_BASED_OUTPATIENT_CLINIC_OR_DEPARTMENT_OTHER): Payer: BC Managed Care – PPO

## 2015-07-28 ENCOUNTER — Encounter (HOSPITAL_COMMUNITY): Payer: Self-pay

## 2015-07-28 ENCOUNTER — Encounter (HOSPITAL_COMMUNITY)
Admission: RE | Disposition: A | Discharge status: Home / Self Care | Payer: Self-pay | Source: Ambulatory Visit | Attending: Internal Medicine

## 2015-07-28 DIAGNOSIS — J45909 Unspecified asthma, uncomplicated: Secondary | ICD-10-CM | POA: Diagnosis not present

## 2015-07-28 DIAGNOSIS — Z7984 Long term (current) use of oral hypoglycemic drugs: Secondary | ICD-10-CM | POA: Insufficient documentation

## 2015-07-28 DIAGNOSIS — G459 Transient cerebral ischemic attack, unspecified: Secondary | ICD-10-CM

## 2015-07-28 DIAGNOSIS — Z6841 Body Mass Index (BMI) 40.0 and over, adult: Secondary | ICD-10-CM | POA: Diagnosis not present

## 2015-07-28 DIAGNOSIS — Z7951 Long term (current) use of inhaled steroids: Secondary | ICD-10-CM | POA: Diagnosis not present

## 2015-07-28 DIAGNOSIS — Z79899 Other long term (current) drug therapy: Secondary | ICD-10-CM | POA: Insufficient documentation

## 2015-07-28 DIAGNOSIS — F419 Anxiety disorder, unspecified: Secondary | ICD-10-CM | POA: Insufficient documentation

## 2015-07-28 DIAGNOSIS — Z8673 Personal history of transient ischemic attack (TIA), and cerebral infarction without residual deficits: Secondary | ICD-10-CM | POA: Insufficient documentation

## 2015-07-28 DIAGNOSIS — Z87891 Personal history of nicotine dependence: Secondary | ICD-10-CM | POA: Insufficient documentation

## 2015-07-28 DIAGNOSIS — Z09 Encounter for follow-up examination after completed treatment for conditions other than malignant neoplasm: Secondary | ICD-10-CM | POA: Diagnosis not present

## 2015-07-28 DIAGNOSIS — R739 Hyperglycemia, unspecified: Secondary | ICD-10-CM | POA: Diagnosis not present

## 2015-07-28 HISTORY — PX: TEE WITHOUT CARDIOVERSION: SHX5443

## 2015-07-28 SURGERY — ECHOCARDIOGRAM, TRANSESOPHAGEAL
Anesthesia: Monitor Anesthesia Care

## 2015-07-28 MED ORDER — PROPOFOL 500 MG/50ML IV EMUL
INTRAVENOUS | Status: DC | PRN
  Administered 2015-07-28: 25 ug/kg/min via INTRAVENOUS

## 2015-07-28 MED ORDER — KETAMINE HCL 100 MG/ML IJ SOLN
INTRAMUSCULAR | Status: DC | PRN
  Administered 2015-07-28 (×2): 10 mg via INTRAVENOUS
  Administered 2015-07-28: 20 mg via INTRAVENOUS

## 2015-07-28 MED ORDER — LIDOCAINE VISCOUS 2 % MT SOLN
OROMUCOSAL | Status: DC | PRN
  Administered 2015-07-28: 1

## 2015-07-28 MED ORDER — MIDAZOLAM HCL 5 MG/ML IJ SOLN
INTRAMUSCULAR | Status: AC
  Filled 2015-07-28: qty 1

## 2015-07-28 MED ORDER — FENTANYL CITRATE (PF) 100 MCG/2ML IJ SOLN
INTRAMUSCULAR | Status: AC
  Filled 2015-07-28: qty 2

## 2015-07-28 MED ORDER — KETAMINE HCL 100 MG/ML IJ SOLN
INTRAMUSCULAR | Status: AC
  Filled 2015-07-28: qty 1

## 2015-07-28 MED ORDER — MIDAZOLAM HCL 5 MG/ML IJ SOLN
INTRAMUSCULAR | Status: AC
  Filled 2015-07-28: qty 2

## 2015-07-28 MED ORDER — SODIUM CHLORIDE 0.9 % IV SOLN
INTRAVENOUS | Status: DC
  Administered 2015-07-28: 09:00:00 via INTRAVENOUS

## 2015-07-28 MED ORDER — LIDOCAINE VISCOUS 2 % MT SOLN
OROMUCOSAL | Status: AC
  Filled 2015-07-28: qty 15

## 2015-07-28 MED ORDER — GLYCOPYRROLATE 0.2 MG/ML IJ SOLN
INTRAMUSCULAR | Status: DC | PRN
  Administered 2015-07-28: 0.2 mg via INTRAVENOUS

## 2015-07-28 MED ORDER — MIDAZOLAM HCL 5 MG/5ML IJ SOLN
INTRAMUSCULAR | Status: DC | PRN
  Administered 2015-07-28: 2 mg via INTRAVENOUS

## 2015-07-28 NOTE — Interval H&P Note (Signed)
History and Physical Interval Note:  07/28/2015 8:08 AM  Brandon Morales  has presented today for surgery, with the diagnosis of TIA  The various methods of treatment have been discussed with the patient and family. After consideration of risks, benefits and other options for treatment, the patient has consented to  Procedure(s) with comments: TRANSESOPHAGEAL ECHOCARDIOGRAM (TEE) (N/A) - WITH BUBBLE as a surgical intervention .  The patient's history has been reviewed, patient examined, no change in status, stable for surgery.  I have reviewed the patient's chart and labs.  Questions were answered to the patient's satisfaction.     [name]

## 2015-07-28 NOTE — Op Note (Signed)
LA, LAA without masses NO PFO as tested with injection of agitated saline  TV normal MV normal  Trivial MR AV normal  No AI PV normal LVEF is low normal Aorta is normal

## 2015-07-28 NOTE — Anesthesia Preprocedure Evaluation (Addendum)
Anesthesia Evaluation  Patient identified by MRN, date of birth, ID band Patient awake    Reviewed: Allergy & Precautions, NPO status , Patient's Chart, lab work & pertinent test results  Airway Mallampati: II  TM Distance: >3 FB Neck ROM: Full    Dental no notable dental hx.    Pulmonary asthma , former smoker,    Pulmonary exam normal breath sounds clear to auscultation       Cardiovascular negative cardio ROS Normal cardiovascular exam Rhythm:Regular Rate:Normal     Neuro/Psych PSYCHIATRIC DISORDERS Anxiety Depression TIA   GI/Hepatic negative GI ROS, Neg liver ROS,   Endo/Other  diabetesMorbid obesity  Renal/GU negative Renal ROS     Musculoskeletal negative musculoskeletal ROS (+)   Abdominal (+) + obese,   Peds  Hematology negative hematology ROS (+)   Anesthesia Other Findings   Reproductive/Obstetrics                            Anesthesia Physical Anesthesia Plan  ASA: IV  Anesthesia Plan: MAC   Post-op Pain Management:    Induction: Intravenous  Airway Management Planned:   Additional Equipment:   Intra-op Plan:   Post-operative Plan:   Informed Consent: I have reviewed the patients History and Physical, chart, labs and discussed the procedure including the risks, benefits and alternatives for the proposed anesthesia with the patient or authorized representative who has indicated his/her understanding and acceptance.   Dental advisory given  Plan Discussed with: CRNA  Anesthesia Plan Comments:         Anesthesia Quick Evaluation

## 2015-07-28 NOTE — H&P (View-Only) (Signed)
Cardiology Office Note   Date:  07/14/2015   ID:  Brandon Morales, DOB 08/19/1985, MRN [id number]  PCP:  Paulino RilyJONES,ENRICO G, MD  Cardiologist:   Rollene RotundaJames Gearldine Looney, MD   No chief complaint on file.     History of Present Illness: Brandon Morales is a 30 y.o. male who presents for evaluation of a TIA. He said he had this he reports an episode of weakness of his left arm and left leg about 4 weeks ago.  He was hospitalized at [place]. He describes having had CTs, carotid and an echocardiogram. I don't have these records. He was treated with aspirin. There was no clear etiology. He was sent here for further workup. He's not had prior cardiac workup. He doesn't describe any overt cardiovascular symptoms. He will occasionally get some pain under his left breast. He does have some snoring. He's had an occasional cough and headache. He doesn't describe any palpitations, presyncope or syncope. He does not describe PND or orthopnea. He's had no weight gain more than usual or edema. 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  Past Medical History  Diagnosis Date  . Asthma   . Anxiety   . Hyperglycemia     Past Surgical History  Procedure Laterality Date  . Tonsillectomy       Current Outpatient Prescriptions  Medication Sig Dispense Refill  . albuterol (PROVENTIL HFA;VENTOLIN HFA) 108 (90 BASE) MCG/ACT inhaler Inhale 2 puffs into the lungs every 6 (six) hours as needed. 1 Inhaler 11  . albuterol (PROVENTIL) (2.5 MG/3ML) 0.083% nebulizer solution Take 3 mLs (2.5 mg total) by nebulization every 6 (six) hours as needed. 75 mL 11  . ALPRAZolam (XANAX) 0.5 MG tablet Take 0.5 mg by mouth 2 (two) times daily.    Marland Kitchen. atorvastatin (LIPITOR) 80 MG tablet     . divalproex (DEPAKOTE ER) 500 MG 24 hr tablet Take 500 mg by mouth daily.    . DULERA 100-5 MCG/ACT AERO     . metFORMIN (GLUCOPHAGE) 500 MG tablet Take 1 tablet (500 mg total) by mouth  2 (two) times daily with a meal. 60 tablet 2  . PARoxetine (PAXIL) 20 MG tablet Take 20 mg by mouth daily.     No current facility-administered medications for this visit.    Allergies:   Erythromycin and Zithromax    Social History:  The patient  reports that he quit smoking about 7 years ago. He does not have any smokeless tobacco history on file. He reports that he does not drink alcohol or use illicit drugs.   Family History:  The patient's family history includes Diabetes in his maternal grandmother and mother; Hypertension in his mother.    ROS:  Please see the history of present illness.   Otherwise, review of systems are positive for none.   All other systems are reviewed and negative.    PHYSICAL EXAM: VS:  BP 128/85 mmHg  Pulse 114  Ht 6\' 3"  (1.905 m)  Wt 454 lb (205.933 kg)  BMI 56.75 kg/m2 , BMI Body mass index is 56.75 kg/(m^2). GENERAL:  Well appearing HEENT:  Pupils equal round and reactive, fundi not visualized, oral mucosa unremarkable NECK:  No jugular venous distention, waveform within normal limits, carotid upstroke brisk and symmetric, no bruits, no thyromegaly LYMPHATICS:  No cervical, inguinal adenopathy LUNGS:  Clear to auscultation bilaterally BACK:  No CVA tenderness CHEST:  Unremarkable HEART:  PMI not displaced or sustained,S1 and S2 within normal limits, no S3, no S4, no clicks, no rubs, no  murmurs ABD:  Flat, positive bowel sounds normal in frequency in pitch, no bruits, no rebound, no guarding, no midline pulsatile mass, no hepatomegaly, no splenomegaly EXT:  2 plus pulses throughout, no edema, no cyanosis no clubbing SKIN:  No rashes no nodules NEURO:  Cranial nerves II through XII grossly intact, motor grossly intact throughout PSYCH:  Cognitively intact, oriented to person place and time    EKG:  EKG is ordered today. The ekg ordered today demonstratessinus tachycardia, rate 114, axis rightward, intervals within normal limits, no acute ST-T  wave changes.   Recent Labs: 06/07/2015: Platelets 213 06/20/2015: ALT 28; BUN 18; Creat 0.88; Hemoglobin 12.7*; Potassium 3.9; Sodium 136    Lipid Panel No results found for: CHOL, TRIG, HDL, CHOLHDL, VLDL, LDLCALC, LDLDIRECT    Wt Readings from Last 3 Encounters:  07/14/15 454 lb (205.933 kg)  07/08/15 456 lb (206.84 kg)  07/04/15 447 lb 12.8 oz (203.121 kg)      Other studies Reviewed: Additional studies/ records that were reviewed today include: . Review of the above records demonstrates:  Please see elsewhere in the note.     ASSESSMENT AND PLAN:  TIA:  I don't see a clear etiology but I need to see the records from [place]. We'll need to make sure he doesn't have atrial fibrillation although I don't strongly suspect this. Symptoms would not be frequent and not to trust a Holter. Brandon Morales will need a 21 day event monitor.  The patients symptoms necessitate an event monitor.  The symptoms are too infrequent to be identified on a Holter monitor.    OBESITY:  The patient understands the need to lose weight with diet and exercise. We have discussed specific strategies for this.      Current medicines are reviewed at length with the patient today.  The patient does not have concerns regarding medicines.  The following changes have been made:  no change  Labs/ tests ordered today include:   Orders Placed This Encounter  Procedures  . Cardiac event monitor  . EKG 12-Lead     Disposition:   FU with me as needed.      Signed, Rollene RotundaJames Zaivion Kundrat, MD  07/14/2015 8:49 AM    [facility]   Addendum:  The patient left I was able to get results from the outside hospital. Interestingly he was given thrombolytics for CVA when he went to the emergency room although he had no findings of abnormality on the CT. He couldn't get an MRI because of his body size. Echo was low quality. It was suggested that he have a TEE with bubble. Also been monitor was  requested. We will go ahead and arrange for TEE with bubble study.

## 2015-07-28 NOTE — Progress Notes (Signed)
Echocardiogram Echocardiogram Transesophageal has been performed.  [name], [name] 07/28/2015, 9:43 AM

## 2015-07-28 NOTE — Anesthesia Postprocedure Evaluation (Signed)
Anesthesia Post Note  Patient: Brandon Morales  Procedure(s) Performed: Procedure(s) (LRB): TRANSESOPHAGEAL ECHOCARDIOGRAM (TEE) (N/A)  Patient location during evaluation: PACU Anesthesia Type: General Level of consciousness: sedated and patient cooperative Pain management: pain level controlled Vital Signs Assessment: post-procedure vital signs reviewed and stable Respiratory status: spontaneous breathing Cardiovascular status: stable Anesthetic complications: no    Last Vitals:  Filed Vitals:   07/28/15 1010 07/28/15 1020  BP: 111/72 109/65  Pulse: 85 69  Temp:    Resp: 21 12    Last Pain: There were no vitals filed for this visit.               Lewie LoronJohn Mlissa Tamayo

## 2015-07-28 NOTE — Progress Notes (Signed)
    Daily Group Progress Note  Program: IOP  Group Time: 9:00-10:30  Participation Level: Active  Behavioral Response: Appropriate  Type of Therapy:  Group Therapy  Summary of Progress: Pt. Presented as alert and engaged in the group process. Pt. Introduced himself to the group. 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 Pt. Participated in discussion about the sympathetic and parasympathetic nervous system and use of meditation and breathing techniques to assist with relaxation and healing of the nervous system.      Shaune PollackBrown, Jennifer B, LPC

## 2015-07-28 NOTE — Discharge Instructions (Signed)

## 2015-07-28 NOTE — Transfer of Care (Signed)
Immediate Anesthesia Transfer of Care Note  Patient: Clent JacksBenjamin Stoffer  Procedure(s) Performed: Procedure(s) with comments: TRANSESOPHAGEAL ECHOCARDIOGRAM (TEE) (N/A) - WITH BUBBLE  Patient Location: Endoscopy Unit  Anesthesia Type:MAC  Level of Consciousness: awake and alert   Airway & Oxygen Therapy: Patient Spontanous Breathing and Patient connected to nasal cannula oxygen  Post-op Assessment: Report given to RN and Post -op Vital signs reviewed and stable  Post vital signs: Reviewed and stable  Last Vitals:  Filed Vitals:   07/28/15 0742 07/28/15 0937  BP: 130/69   Pulse: 98 86  Resp: 17 19    Last Pain: There were no vitals filed for this visit.       Complications: No apparent anesthesia complications

## 2015-07-29 ENCOUNTER — Other Ambulatory Visit (HOSPITAL_COMMUNITY): Payer: BC Managed Care – PPO

## 2015-07-29 DIAGNOSIS — G451 Carotid artery syndrome (hemispheric): Secondary | ICD-10-CM | POA: Diagnosis not present

## 2015-08-01 ENCOUNTER — Other Ambulatory Visit (HOSPITAL_COMMUNITY): Payer: BC Managed Care – PPO | Admitting: Licensed Clinical Social Worker

## 2015-08-01 DIAGNOSIS — F349 Persistent mood [affective] disorder, unspecified: Secondary | ICD-10-CM

## 2015-08-01 DIAGNOSIS — F411 Generalized anxiety disorder: Secondary | ICD-10-CM

## 2015-08-01 NOTE — Progress Notes (Signed)
Daily Group Progress Note  Program: IOP  Group Time: 9:00-12:00  Participation Level: Active  Behavioral Response: Appropriate  Type of Therapy:  Psychoeducation/Group Therapy  Summary of Progress: Pt participated in a presentation by [facility] pharmacist on medication management and asked appropriate medication questions. The second part of group focused on a coping skill for depression. Pt participated in a discussion about how pets can be used as therapy. Pt shared how pets have assisted in diminishing depressive symptoms.  

## 2015-08-02 ENCOUNTER — Other Ambulatory Visit (HOSPITAL_COMMUNITY): Payer: BC Managed Care – PPO | Admitting: Licensed Clinical Social Worker

## 2015-08-02 DIAGNOSIS — F411 Generalized anxiety disorder: Secondary | ICD-10-CM

## 2015-08-02 DIAGNOSIS — F349 Persistent mood [affective] disorder, unspecified: Secondary | ICD-10-CM

## 2015-08-02 NOTE — Progress Notes (Signed)
Daily Group Progress Note  Program: IOP  Group Time: 10:45-12:00pm  Participation Level: Active  Behavioral Response: Appropriate  Type of Therapy:  Psychotherapy  Summary of Progress:  Pt participated in a discussion on healthy boundaries. Pt was encouraged to set personal boundaries with limits and rules, which encourages mental wellness.   

## 2015-08-02 NOTE — Progress Notes (Signed)
    Daily Group Progress Note  Program: IOP  Group Time: 9:00-11:00  Participation Level: Active  Behavioral Response: Appropriate  Type of Therapy:  Group Therapy  Summary of Progress: Pt. Is talkative, actively engages in group process. Pt. Participated in discussion about the importance of general wellness i.e., nutrition, adequate sleep, moderate amounts of exercise, and medication management or optimal management of mood disorders. Pt. Discussed that he likes to move around and "play" in the pool for exercise. Pt. Also stated that he drinks a lot of milk and enjoys eating large salads when he is feeling depressed. Pt. Discussed that he goes to sleep around 6:30 in the evening and gets up at midnight to perform household tasks. Pt. Was encouraged to take his sleep medicine later in the evening, but Pt. Stated that he likes going to sleep early and enjoys being active when his house is quiet.     Shaune PollackBrown, Lucindy Borel B, LPC

## 2015-08-03 ENCOUNTER — Other Ambulatory Visit (HOSPITAL_COMMUNITY): Payer: BC Managed Care – PPO | Admitting: Psychiatry

## 2015-08-03 DIAGNOSIS — F411 Generalized anxiety disorder: Secondary | ICD-10-CM | POA: Diagnosis not present

## 2015-08-03 DIAGNOSIS — R000 Tachycardia, unspecified: Secondary | ICD-10-CM

## 2015-08-03 DIAGNOSIS — F349 Persistent mood [affective] disorder, unspecified: Secondary | ICD-10-CM

## 2015-08-03 NOTE — Progress Notes (Signed)
    Daily Group Progress Note  Program: IOP  Time: 9:00-10:45 Activity Level: inactive Behavioral Response: disengaged Type: Group Therapy Summary: Pt. was very disengaged from the group today. He was very quiet, even when engaged by the therapist.   Shaune PollackBrown, Jennifer B, [facility]

## 2015-08-03 NOTE — Progress Notes (Signed)
Daily Group Progress Note  Program: IOP  Group Time: 10:45-12:00pm  Participation Level: Active  Behavioral Response: Appropriate  Type of Therapy:  Psychotherapy  Summary of Progress:  Pt participated in chair yoga, facilitated by [facility] therapist and certified yoga instructor Leanne Yates. The activity focused on breath with movement. For patients with anxiety and depression as part of self-care and mindfulness, yoga helps train the relaxation response.    Floriene Jeschke S. Brewer Hitchman, LCAS-A   

## 2015-08-04 ENCOUNTER — Telehealth (HOSPITAL_COMMUNITY): Payer: Self-pay | Admitting: Psychiatry

## 2015-08-04 ENCOUNTER — Other Ambulatory Visit (HOSPITAL_COMMUNITY): Payer: BC Managed Care – PPO | Admitting: Psychiatry

## 2015-08-04 ENCOUNTER — Ambulatory Visit (INDEPENDENT_AMBULATORY_CARE_PROVIDER_SITE_OTHER): Payer: Self-pay

## 2015-08-04 ENCOUNTER — Telehealth: Payer: Self-pay | Admitting: Neurology

## 2015-08-04 DIAGNOSIS — Z0289 Encounter for other administrative examinations: Secondary | ICD-10-CM

## 2015-08-04 DIAGNOSIS — G451 Carotid artery syndrome (hemispheric): Secondary | ICD-10-CM

## 2015-08-04 NOTE — Telephone Encounter (Signed)
I called patient. MRI the brain was completely normal. The patient now claims that he has some low back issues. He has been set up for a sleep evaluation.

## 2015-08-05 ENCOUNTER — Other Ambulatory Visit (HOSPITAL_COMMUNITY): Payer: BC Managed Care – PPO | Admitting: Psychiatry

## 2015-08-08 ENCOUNTER — Other Ambulatory Visit (HOSPITAL_COMMUNITY): Payer: BC Managed Care – PPO | Admitting: Licensed Clinical Social Worker

## 2015-08-08 DIAGNOSIS — F411 Generalized anxiety disorder: Secondary | ICD-10-CM

## 2015-08-08 DIAGNOSIS — F349 Persistent mood [affective] disorder, unspecified: Secondary | ICD-10-CM

## 2015-08-09 ENCOUNTER — Other Ambulatory Visit (HOSPITAL_COMMUNITY): Payer: BC Managed Care – PPO | Admitting: Psychiatry

## 2015-08-09 NOTE — Progress Notes (Signed)
Daily Group Progress Note                                           IOP     Group Time: 9:00-12:00 pm  Activity Level: inactive  Behavioral Response: disengaged, responsive  Type of Therapy: Psychoeducation/Group therapy  Summary of Progress:  Patient was present for the discussion with the pharmacist. Patient shared that he was in extreme pain because of his back. Therapist encouraged patient to contact his doctor about his pain. Patient is very disengaged with the group process.    Vernona Rieger, LCAS-A

## 2015-08-10 ENCOUNTER — Other Ambulatory Visit (HOSPITAL_COMMUNITY): Payer: BC Managed Care – PPO

## 2015-08-11 ENCOUNTER — Ambulatory Visit (INDEPENDENT_AMBULATORY_CARE_PROVIDER_SITE_OTHER): Payer: BC Managed Care – PPO | Admitting: Neurology

## 2015-08-11 ENCOUNTER — Encounter: Payer: Self-pay | Admitting: Neurology

## 2015-08-11 ENCOUNTER — Other Ambulatory Visit (HOSPITAL_COMMUNITY): Payer: BC Managed Care – PPO

## 2015-08-11 VITALS — BP 160/100 | HR 88 | Resp 22 | Ht 75.0 in | Wt >= 6400 oz

## 2015-08-11 DIAGNOSIS — R0683 Snoring: Secondary | ICD-10-CM

## 2015-08-11 DIAGNOSIS — R519 Headache, unspecified: Secondary | ICD-10-CM

## 2015-08-11 DIAGNOSIS — G4710 Hypersomnia, unspecified: Secondary | ICD-10-CM

## 2015-08-11 DIAGNOSIS — R51 Headache: Secondary | ICD-10-CM

## 2015-08-11 NOTE — Progress Notes (Signed)
Subjective:    Patient ID: Brandon Morales is a 30 y.o. male.  HPI     Brandon Foley, MD, PhD [facility] [address] [address] [place], [place] [address]  Dear Brandon Morales,   I saw your patient, Brandon Morales, upon your kind request, in my clinic today for initial consultation of his sleep disorder, in particular, concern for underlying obstructive sleep apnea. The patient is accompanied by his mother today. As you know, Brandon Morales is a 30 year old right-handed gentleman with an underlying medical history of anxiety, asthma, hyperglycemia, history of TIA, and morbid obesity, who reports snoring and excessive daytime somnolence. I reviewed your office note from 07/20/2015. He was referred for a brain MRI at the time. He had a brain MRI without contrast on 07/29/2015 which was reported as normal. He reports sleep disruption, morning headaches, Epworth sleepiness score is 19 out of 24 today, fatigue score is 47 out of 63. He reports no family history of OSA. He reports occasionally waking up with gasping sensation. He reports back pain for the past 3 weeks. He has been taking Tylenol with Codeine as per PCP. He has an appointment with orthopedics tomorrow. He has chronic lower extremity edema. He has not worked since May 2017. He works as a Public relations account executive. Bedtime is variable, he does not currently keep a scheduled. Wakeup time is variable, again, he doesn't keep a schedule. He is not very forthcoming with his information. He is not sure how much caffeine he drinks but he does drink coffee and sodas, [facility], perhaps 6-8 cups of coffee per day and some sodas and cans he lives with his mother who moved in with him after her retirement. He is single, no children. He quit drinking alcohol last year, he does not smoke. He has nocturia about twice per night, feels restless, has discomfort in both legs and lower back.  His Past Medical History Is  Significant For: Past Medical History:  Diagnosis Date  . Anxiety   . Asthma   . Depression   . Diabetes mellitus without complication (HCC)   . Hyperglycemia   . TIA (transient ischemic attack) 07/20/2015    His Past Surgical History Is Significant For: Past Surgical History:  Procedure Laterality Date  . TEE WITHOUT CARDIOVERSION N/A 07/28/2015   Procedure: TRANSESOPHAGEAL ECHOCARDIOGRAM (TEE);  Surgeon: Pricilla Riffle, MD;  Location: [facility] ENDOSCOPY;  Service: Cardiovascular;  Laterality: N/A;  WITH BUBBLE  . TONSILLECTOMY      His Family History Is Significant For: Family History  Problem Relation Age of Onset  . Diabetes Mother   . Hypertension Mother   . Depression Mother   . Diabetes Maternal Grandmother     His Social History Is Significant For: Social History   Social History  . Marital status: Single    Spouse name: N/A  . Number of children: 0  . Years of education: Some coll   Occupational History  . Corporate treasurer    Social History Main Topics  . Smoking status: Former Smoker    Quit date: 03/10/2008  . Smokeless tobacco: None  . Alcohol use No  . Drug use: No  . Sexual activity: No   Other Topics Concern  . None   Social History Narrative   Mother lives with the patient.    Right-handed   Drinks about 4 cups of coffee or tea per day    His Allergies Are:  Allergies  Allergen Reactions  .  Erythromycin     Hives, itching   . Zithromax [Azithromycin Dihydrate]     unknown  :   His Current Medications Are:  Outpatient Encounter Prescriptions as of 08/11/2015  Medication Sig  . acetaminophen-codeine (TYLENOL #3) 300-30 MG tablet   . albuterol (PROVENTIL HFA;VENTOLIN HFA) 108 (90 BASE) MCG/ACT inhaler Inhale 2 puffs into the lungs every 6 (six) hours as needed.  Marland Kitchen albuterol (PROVENTIL) (2.5 MG/3ML) 0.083% nebulizer solution Take 3 mLs (2.5 mg total) by nebulization every 6 (six) hours as needed.  . ALPRAZolam (XANAX) 0.5 MG tablet Take 0.5 mg  by mouth 2 (two) times daily.  Marland Kitchen aspirin 81 MG tablet Take 81 mg by mouth daily.  Marland Kitchen atorvastatin (LIPITOR) 80 MG tablet   . divalproex (DEPAKOTE ER) 500 MG 24 hr tablet Take 500 mg by mouth daily.  . DULERA 100-5 MCG/ACT AERO   . ibuprofen (ADVIL,MOTRIN) 800 MG tablet   . metFORMIN (GLUCOPHAGE) 500 MG tablet Take 1 tablet (500 mg total) by mouth 2 (two) times daily with a meal.  . PARoxetine (PAXIL) 20 MG tablet Take 20 mg by mouth daily.   No facility-administered encounter medications on file as of 08/11/2015.   :  Review of Systems:  Out of a complete 14 point review of systems, all are reviewed and negative with the exception of these symptoms as listed below: Review of Systems  Neurological:       Patient has trouble falling asleep, snoring, wakes up choking, wakes up feeling tired, daytime tiredness, morning headaches. Restless legs.   Back pain.   Psychiatric/Behavioral:       Depression, anxiety, too much sleep, decreased energy   Epworth Sleepiness Scale 0= would never doze 1= slight chance of dozing 2= moderate chance of dozing 3= high chance of dozing  Sitting and reading:3 Watching TV:2 Sitting inactive in a public place (ex. Theater or meeting):1 As a passenger in a car for an hour without a break:3 Lying down to rest in the afternoon:3 Sitting and talking to someone:1 Sitting quietly after lunch (no alcohol):3 In a car, while stopped in traffic:3 Total:19  Objective:  Neurologic Exam  Physical Exam Physical Examination:   Vitals:   08/11/15 1531  BP: (!) 160/100  Pulse: 88  Resp: (!) 22   General Examination: The patient is a very pleasant 30 y.o. male in no acute distress. He appears well-developed and well-nourished and adequately groomed. He is morbidly obese.  HEENT: Normocephalic, atraumatic, pupils are equal, round and reactive to light and accommodation. Extraocular tracking is good without limitation to gaze excursion or nystagmus noted. Normal  smooth pursuit is noted. Hearing is grossly intact. Face is symmetric with normal facial animation and normal facial sensation. Speech is clear with no dysarthria noted. There is no hypophonia. There is no lip, neck/head, jaw or voice tremor. Neck is supple with full range of passive and active motion. There are no carotid bruits on auscultation. Oropharynx exam reveals: moderate mouth dryness, adequate to marginal dental hygiene and marked airway crowding, due to smaller airway entry, redundant soft palate, large tongue. Mallampati is class III. Tongue protrudes centrally and palate elevates symmetrically. Tonsils are absent. Neck size is 22 in.    Chest: Clear to auscultation without wheezing, rhonchi or crackles noted.  Heart: S1+S2+0, regular and normal without murmurs, rubs or gallops noted.   Abdomen: Soft, non-tender and non-distended with normal bowel sounds appreciated on auscultation.  Extremities: There is 2+ pitting edema in the distal lower  extremities bilaterally. Pedal pulses are intact.  Skin: Warm and dry without trophic changes noted. There are no varicose veins.  Musculoskeletal: exam reveals no obvious joint deformities, tenderness or joint swelling or erythema, he reports low back pain.   Neurologically:  Mental status: The patient is awake, alert and oriented in all 4 spheres. His immediate and remote memory, attention, language skills and fund of knowledge are appropriate. There is no evidence of aphasia, agnosia, apraxia or anomia. Speech is clear with normal prosody and enunciation. Thought process is linear. Mood is constricted and affect is blunted.  Cranial nerves II - XII are as described above under HEENT exam. In addition: shoulder shrug is normal with equal shoulder height noted. Motor exam: Normal bulk, strength and tone is noted. There is no drift, tremor or rebound. Romberg is negative. Fine motor skills slow. No dysmetria or intention tremor.  Sensory exam:  intact in the upper extremities, reports inability to feel vibration, temperature, and even light touch diffusely in the lower extremities.  He stands up with difficulty, he stands wide-based. He walks with a crutch. He holds onto the crutch and walks and small starter steps, no one-sided limp noted, reports low back pain with walking. Posture is appropriate.  Assessment and Plan:   In summary, Brandon Morales is a very pleasant 30 y.o.-year old male with an underlying medical history of anxiety, asthma, hyperglycemia, history of TIA, and morbid obesity, whose history and physical exam are concerning for obstructive sleep apnea (OSA). I had a long chat with the patient and his mother about my findings and the diagnosis of OSA, its prognosis and treatment options. We talked about medical treatments, surgical interventions and non-pharmacological approaches. I explained in particular the risks and ramifications of untreated moderate to severe OSA, especially with respect to developing cardiovascular disease down the Road, including congestive heart failure, difficult to treat hypertension, cardiac arrhythmias, or stroke. Even type 2 diabetes has, in part, been linked to untreated OSA. Symptoms of untreated OSA include daytime sleepiness, memory problems, mood irritability and mood disorder such as depression and anxiety, lack of energy, as well as recurrent headaches, especially morning headaches. We talked about trying to maintain a healthy lifestyle in general, as well as the importance of weight control. I encouraged the patient to eat healthy, exercise daily and keep well hydrated, to keep a scheduled bedtime and wake time routine, to not skip any meals and eat healthy snacks in between meals. I advised the patient not to drive when feeling sleepy. I recommended the following at this time: sleep study with potential positive airway pressure titration. (We will score hypopneas at 3% and split the sleep  study into diagnostic and treatment portion, if the estimated. 2 hour AHI is >15/h).   I explained the sleep test procedure to the patient and also outlined treatment options of OSA, including the use of CPAP.  I answered all their questions today and the patient and his mother were in agreement. I would like to see him back after the sleep study is completed and encouraged him to call with any interim questions, concerns, problems or updates.   Thank you very much for allowing me to participate in the care of this patient. If I can be of any further assistance to you please do not hesitate to talk to me.   Sincerely,   Brandon Foley, MD, PhD

## 2015-08-11 NOTE — Patient Instructions (Signed)

## 2015-08-12 ENCOUNTER — Other Ambulatory Visit (HOSPITAL_COMMUNITY): Payer: BC Managed Care – PPO

## 2015-08-15 ENCOUNTER — Other Ambulatory Visit (HOSPITAL_COMMUNITY): Payer: BC Managed Care – PPO

## 2015-08-15 ENCOUNTER — Other Ambulatory Visit: Payer: Self-pay | Admitting: Orthopedic Surgery

## 2015-08-15 DIAGNOSIS — M5417 Radiculopathy, lumbosacral region: Secondary | ICD-10-CM

## 2015-08-16 ENCOUNTER — Other Ambulatory Visit (HOSPITAL_COMMUNITY): Payer: BC Managed Care – PPO

## 2015-08-17 ENCOUNTER — Other Ambulatory Visit (HOSPITAL_COMMUNITY): Payer: BC Managed Care – PPO

## 2015-08-18 ENCOUNTER — Other Ambulatory Visit (HOSPITAL_COMMUNITY): Payer: BC Managed Care – PPO

## 2015-08-19 ENCOUNTER — Other Ambulatory Visit (HOSPITAL_COMMUNITY): Payer: BC Managed Care – PPO | Admitting: Psychiatry

## 2015-08-21 ENCOUNTER — Ambulatory Visit
Admission: RE | Admit: 2015-08-21 | Discharge: 2015-08-21 | Disposition: A | Discharge status: Home / Self Care | Payer: BC Managed Care – PPO | Source: Ambulatory Visit | Attending: Orthopedic Surgery | Admitting: Orthopedic Surgery

## 2015-08-21 ENCOUNTER — Emergency Department (HOSPITAL_COMMUNITY): Payer: BC Managed Care – PPO

## 2015-08-21 ENCOUNTER — Observation Stay (HOSPITAL_COMMUNITY)
Admission: EM | Admit: 2015-08-21 | Discharge: 2015-08-23 | Disposition: A | Discharge status: Home Health | Payer: BC Managed Care – PPO | Attending: Internal Medicine | Admitting: Internal Medicine

## 2015-08-21 ENCOUNTER — Encounter (HOSPITAL_COMMUNITY): Payer: Self-pay

## 2015-08-21 ENCOUNTER — Observation Stay (HOSPITAL_COMMUNITY): Payer: BC Managed Care – PPO

## 2015-08-21 DIAGNOSIS — M79662 Pain in left lower leg: Secondary | ICD-10-CM | POA: Insufficient documentation

## 2015-08-21 DIAGNOSIS — F329 Major depressive disorder, single episode, unspecified: Secondary | ICD-10-CM | POA: Diagnosis not present

## 2015-08-21 DIAGNOSIS — Z79899 Other long term (current) drug therapy: Secondary | ICD-10-CM | POA: Diagnosis not present

## 2015-08-21 DIAGNOSIS — G4733 Obstructive sleep apnea (adult) (pediatric): Secondary | ICD-10-CM | POA: Insufficient documentation

## 2015-08-21 DIAGNOSIS — Z7982 Long term (current) use of aspirin: Secondary | ICD-10-CM | POA: Diagnosis not present

## 2015-08-21 DIAGNOSIS — M545 Low back pain: Secondary | ICD-10-CM | POA: Diagnosis not present

## 2015-08-21 DIAGNOSIS — M5416 Radiculopathy, lumbar region: Secondary | ICD-10-CM | POA: Insufficient documentation

## 2015-08-21 DIAGNOSIS — J45909 Unspecified asthma, uncomplicated: Secondary | ICD-10-CM | POA: Insufficient documentation

## 2015-08-21 DIAGNOSIS — Z6841 Body Mass Index (BMI) 40.0 and over, adult: Secondary | ICD-10-CM | POA: Diagnosis not present

## 2015-08-21 DIAGNOSIS — M79604 Pain in right leg: Secondary | ICD-10-CM | POA: Insufficient documentation

## 2015-08-21 DIAGNOSIS — G459 Transient cerebral ischemic attack, unspecified: Secondary | ICD-10-CM | POA: Diagnosis present

## 2015-08-21 DIAGNOSIS — F411 Generalized anxiety disorder: Secondary | ICD-10-CM | POA: Diagnosis not present

## 2015-08-21 DIAGNOSIS — M5417 Radiculopathy, lumbosacral region: Secondary | ICD-10-CM

## 2015-08-21 DIAGNOSIS — M549 Dorsalgia, unspecified: Secondary | ICD-10-CM

## 2015-08-21 DIAGNOSIS — Z7984 Long term (current) use of oral hypoglycemic drugs: Secondary | ICD-10-CM | POA: Insufficient documentation

## 2015-08-21 DIAGNOSIS — Y92009 Unspecified place in unspecified non-institutional (private) residence as the place of occurrence of the external cause: Secondary | ICD-10-CM | POA: Diagnosis not present

## 2015-08-21 DIAGNOSIS — Z8673 Personal history of transient ischemic attack (TIA), and cerebral infarction without residual deficits: Secondary | ICD-10-CM | POA: Insufficient documentation

## 2015-08-21 DIAGNOSIS — E669 Obesity, unspecified: Secondary | ICD-10-CM

## 2015-08-21 DIAGNOSIS — E119 Type 2 diabetes mellitus without complications: Secondary | ICD-10-CM | POA: Insufficient documentation

## 2015-08-21 DIAGNOSIS — M5410 Radiculopathy, site unspecified: Secondary | ICD-10-CM

## 2015-08-21 DIAGNOSIS — Z87891 Personal history of nicotine dependence: Secondary | ICD-10-CM | POA: Diagnosis not present

## 2015-08-21 DIAGNOSIS — W19XXXA Unspecified fall, initial encounter: Secondary | ICD-10-CM | POA: Insufficient documentation

## 2015-08-21 DIAGNOSIS — M5459 Other low back pain: Secondary | ICD-10-CM | POA: Diagnosis present

## 2015-08-21 HISTORY — DX: Obstructive sleep apnea (adult) (pediatric): G47.33

## 2015-08-21 LAB — CBC WITH DIFFERENTIAL/PLATELET
BASOS ABS: 0 10*3/uL (ref 0.0–0.1)
Basophils Relative: 1 %
EOS ABS: 0.2 10*3/uL (ref 0.0–0.7)
EOS PCT: 4 %
HCT: 39.1 % (ref 39.0–52.0)
Hemoglobin: 13.1 g/dL (ref 13.0–17.0)
LYMPHS PCT: 26 %
Lymphs Abs: 1.4 10*3/uL (ref 0.7–4.0)
MCH: 27.5 pg (ref 26.0–34.0)
MCHC: 33.5 g/dL (ref 30.0–36.0)
MCV: 82.1 fL (ref 78.0–100.0)
MONO ABS: 0.5 10*3/uL (ref 0.1–1.0)
Monocytes Relative: 9 %
Neutro Abs: 3.3 10*3/uL (ref 1.7–7.7)
Neutrophils Relative %: 60 %
PLATELETS: 184 10*3/uL (ref 150–400)
RBC: 4.76 MIL/uL (ref 4.22–5.81)
RDW: 13.5 % (ref 11.5–15.5)
WBC: 5.4 10*3/uL (ref 4.0–10.5)

## 2015-08-21 LAB — I-STAT CHEM 8, ED
BUN: 14 mg/dL (ref 6–20)
CALCIUM ION: 1.05 mmol/L — AB (ref 1.13–1.30)
CREATININE: 0.8 mg/dL (ref 0.61–1.24)
Chloride: 103 mmol/L (ref 101–111)
GLUCOSE: 128 mg/dL — AB (ref 65–99)
HCT: 38 % — ABNORMAL LOW (ref 39.0–52.0)
HEMOGLOBIN: 12.9 g/dL — AB (ref 13.0–17.0)
POTASSIUM: 3.6 mmol/L (ref 3.5–5.1)
Sodium: 141 mmol/L (ref 135–145)
TCO2: 24 mmol/L (ref 0–100)

## 2015-08-21 LAB — GLUCOSE, CAPILLARY
Glucose-Capillary: 130 mg/dL — ABNORMAL HIGH (ref 65–99)
Glucose-Capillary: 182 mg/dL — ABNORMAL HIGH (ref 65–99)

## 2015-08-21 MED ORDER — ALBUTEROL SULFATE (2.5 MG/3ML) 0.083% IN NEBU: 2.5000 mg | INHALATION_SOLUTION | Freq: Four times a day (QID) | RESPIRATORY_TRACT | Status: DC | PRN

## 2015-08-21 MED ORDER — INSULIN ASPART 100 UNIT/ML ~~LOC~~ SOLN
0.0000 [IU] | Freq: Three times a day (TID) | SUBCUTANEOUS | Status: DC
  Administered 2015-08-22 – 2015-08-23 (×3): 2 [IU] via SUBCUTANEOUS
  Administered 2015-08-23: 1 [IU] via SUBCUTANEOUS

## 2015-08-21 MED ORDER — ACETAMINOPHEN 650 MG RE SUPP: 650.0000 mg | Freq: Four times a day (QID) | RECTAL | Status: DC | PRN

## 2015-08-21 MED ORDER — CYCLOBENZAPRINE HCL 10 MG PO TABS
5.0000 mg | ORAL_TABLET | Freq: Every day | ORAL | Status: DC | PRN
  Administered 2015-08-21 – 2015-08-23 (×3): 5 mg via ORAL
  Filled 2015-08-21 (×3): qty 1

## 2015-08-21 MED ORDER — KETOROLAC TROMETHAMINE 15 MG/ML IJ SOLN
15.0000 mg | Freq: Four times a day (QID) | INTRAMUSCULAR | Status: DC | PRN
  Administered 2015-08-21 – 2015-08-23 (×6): 15 mg via INTRAVENOUS
  Filled 2015-08-21 (×6): qty 1

## 2015-08-21 MED ORDER — HYDROMORPHONE HCL 1 MG/ML IJ SOLN
2.0000 mg | Freq: Once | INTRAMUSCULAR | Status: AC
  Administered 2015-08-21: 2 mg via INTRAVENOUS
  Filled 2015-08-21: qty 2

## 2015-08-21 MED ORDER — PAROXETINE HCL 20 MG PO TABS
20.0000 mg | ORAL_TABLET | Freq: Every day | ORAL | Status: DC
  Administered 2015-08-21 – 2015-08-23 (×3): 20 mg via ORAL
  Filled 2015-08-21 (×3): qty 1

## 2015-08-21 MED ORDER — ALBUTEROL SULFATE HFA 108 (90 BASE) MCG/ACT IN AERS: 2.0000 | INHALATION_SPRAY | Freq: Four times a day (QID) | RESPIRATORY_TRACT | Status: DC | PRN

## 2015-08-21 MED ORDER — ONDANSETRON HCL 4 MG/2ML IJ SOLN: 4.0000 mg | Freq: Four times a day (QID) | INTRAMUSCULAR | Status: DC | PRN

## 2015-08-21 MED ORDER — HYDROMORPHONE HCL 1 MG/ML IJ SOLN
1.0000 mg | Freq: Once | INTRAMUSCULAR | Status: AC
  Administered 2015-08-21: 1 mg via INTRAVENOUS
  Filled 2015-08-21: qty 1

## 2015-08-21 MED ORDER — ONDANSETRON HCL 4 MG PO TABS: 4.0000 mg | ORAL_TABLET | Freq: Four times a day (QID) | ORAL | Status: DC | PRN

## 2015-08-21 MED ORDER — ALPRAZOLAM 0.5 MG PO TABS
0.5000 mg | ORAL_TABLET | Freq: Every day | ORAL | Status: DC
  Administered 2015-08-21 – 2015-08-22 (×2): 0.5 mg via ORAL
  Filled 2015-08-21 (×2): qty 1

## 2015-08-21 MED ORDER — IBUPROFEN-DIPHENHYDRAMINE HCL 200-25 MG PO CAPS: 1.0000 | ORAL_CAPSULE | Freq: Every evening | ORAL | Status: DC | PRN

## 2015-08-21 MED ORDER — ACETAMINOPHEN 325 MG PO TABS
650.0000 mg | ORAL_TABLET | Freq: Four times a day (QID) | ORAL | Status: DC | PRN
  Administered 2015-08-22: 650 mg via ORAL
  Filled 2015-08-21: qty 2

## 2015-08-21 MED ORDER — SODIUM CHLORIDE 0.9 % IV SOLN
INTRAVENOUS | Status: AC
  Administered 2015-08-21: 16:00:00 via INTRAVENOUS

## 2015-08-21 MED ORDER — DIVALPROEX SODIUM ER 500 MG PO TB24
500.0000 mg | ORAL_TABLET | Freq: Every day | ORAL | Status: DC
  Administered 2015-08-21 – 2015-08-23 (×3): 500 mg via ORAL
  Filled 2015-08-21 (×3): qty 1

## 2015-08-21 MED ORDER — POLYETHYLENE GLYCOL 3350 17 G PO PACK: 17.0000 g | PACK | Freq: Every day | ORAL | Status: DC | PRN

## 2015-08-21 MED ORDER — ENOXAPARIN SODIUM 40 MG/0.4ML ~~LOC~~ SOLN
40.0000 mg | SUBCUTANEOUS | Status: DC
  Administered 2015-08-21: 40 mg via SUBCUTANEOUS
  Filled 2015-08-21: qty 0.4

## 2015-08-21 MED ORDER — INSULIN ASPART 100 UNIT/ML ~~LOC~~ SOLN: 0.0000 [IU] | Freq: Every day | SUBCUTANEOUS | Status: DC

## 2015-08-21 MED ORDER — ACETAMINOPHEN-CODEINE #3 300-30 MG PO TABS
1.0000 | ORAL_TABLET | Freq: Four times a day (QID) | ORAL | Status: DC | PRN
  Administered 2015-08-21 – 2015-08-23 (×7): 2 via ORAL
  Filled 2015-08-21 (×7): qty 2

## 2015-08-21 MED ORDER — MOMETASONE FURO-FORMOTEROL FUM 100-5 MCG/ACT IN AERO
2.0000 | INHALATION_SPRAY | Freq: Two times a day (BID) | RESPIRATORY_TRACT | Status: DC
  Filled 2015-08-21: qty 8.8

## 2015-08-21 MED ORDER — HYDROMORPHONE HCL 1 MG/ML IJ SOLN
0.5000 mg | INTRAMUSCULAR | Status: DC | PRN
  Administered 2015-08-21 – 2015-08-22 (×2): 0.5 mg via INTRAVENOUS
  Filled 2015-08-21 (×2): qty 1

## 2015-08-21 NOTE — ED Triage Notes (Signed)
To room via EMS.  [facility], pt was to have MRI done on back today but was unable to tolerate laying flat.  Pt reports onset 3 weeks ago started exercising, went to bed and woke up next morning with back pain.  Lower back pain radiating down both legs to knees.  Pt moaning in pain.  Pt has been taking pain med with codeine but ran out 3 days ago.  Med seemed to help decrease pain and in past 3 days has been taking Ibuprofen with no relief. Pt laying on left side trying to get comfortable on stretcher but unable to do so.

## 2015-08-21 NOTE — H&P (Signed)
History and Physical    Dyllen Menning ZOX:[id number] DOB: 03-Dec-1985 DOA: 08/21/2015  PCP: Paulino Rily, MD Patient coming from: [facility] [facility]  Chief Complaint: back pain  HPI: Tydarius Yawn is a 30 y.o. male with medical history significant for morbid obesity, obstructive sleep apnea, hyperglycemia, anxiety, mood disorder recent workup for TIA presents to the emergency department chief complaint of intractable back pain. Being admitted for pain management.  Information is obtained from the patient and his mother. 4 days ago patient fell at home unable to get up off the floor due to low back pain. Mother reports it took 4 hours for him to work his way back to his bed using an office chair with wheels. 3 weeks prior to that he experienced a "twinge" walking up 5 steps carrying a load of laundry. He sought medical treatment was provided Tylenol No. 3 with partial relief but he has run out of Tylenol 3. In this absence he was taking Advil without adequate relief. He was scheduled for an MRI today as an outpatient was unable to lie flat secondary to intractable pain. Associated symptoms include some numbness of his left knee inability to bear weight on that left leg. He denies numbness or tingling of feet. He denies incontinence of bladder or bowel. Denies abdominal pain nausea vomiting dysuria hematuria frequency or urgency. Has headache dizziness syncope or near-syncope. Denies lower extremity edema shortness of breath orthopnea.   ED Course: In the emergency department he's afebrile hemodynamically stable and not hypoxic. He is provided with a total of 5 mg of Dilaudid IV.  Review of Systems: As per HPI otherwise 10 point review of systems negative.   Ambulatory Status: Normally ambulates independently with steady gait.  Past Medical History:  Diagnosis Date  . Anxiety   . Asthma   . Depression   . Diabetes mellitus without complication (HCC)   . Hyperglycemia   . OSA  (obstructive sleep apnea)   . TIA (transient ischemic attack) 07/20/2015    Past Surgical History:  Procedure Laterality Date  . TEE WITHOUT CARDIOVERSION N/A 07/28/2015   Procedure: TRANSESOPHAGEAL ECHOCARDIOGRAM (TEE);  Surgeon: Pricilla Riffle, MD;  Location: [facility] ENDOSCOPY;  Service: Cardiovascular;  Laterality: N/A;  WITH BUBBLE  . TONSILLECTOMY      Social History   Social History  . Marital status: Single    Spouse name: N/A  . Number of children: 0  . Years of education: Some coll   Occupational History  . Corporate treasurer    Social History Main Topics  . Smoking status: Former Smoker    Quit date: 03/10/2008  . Smokeless tobacco: Never Used  . Alcohol use No  . Drug use: No  . Sexual activity: No   Other Topics Concern  . Not on file   Social History Narrative   Mother lives with the patient.    Right-handed   Drinks about 4 cups of coffee or tea per day  Lives at home his mother lives with him. He is employed as a Public relations account executive  Allergies  Allergen Reactions  . Erythromycin     Hives, itching   . Zithromax [Azithromycin Dihydrate]     unknown    Family History  Problem Relation Age of Onset  . Diabetes Mother   . Hypertension Mother   . Depression Mother   . Diabetes Maternal Grandmother     Prior to Admission medications   Medication Sig Start Date End Date Taking? Authorizing Provider  acetaminophen-codeine (  TYLENOL #3) 300-30 MG tablet Take 1 tablet by mouth every 8 (eight) hours as needed.  08/10/15  Yes Historical Provider, MD  albuterol (PROVENTIL HFA;VENTOLIN HFA) 108 (90 BASE) MCG/ACT inhaler Inhale 2 puffs into the lungs every 6 (six) hours as needed. Patient taking differently: Inhale 2 puffs into the lungs every 6 (six) hours as needed for wheezing.  07/20/11  Yes Tonye Pearson, MD  albuterol (PROVENTIL) (2.5 MG/3ML) 0.083% nebulizer solution Take 3 mLs (2.5 mg total) by nebulization every 6 (six) hours as needed. Patient taking  differently: Take 2.5 mg by nebulization every 6 (six) hours as needed for wheezing.  07/20/11  Yes Tonye Pearson, MD  ALPRAZolam Prudy Feeler) 0.5 MG tablet Take 0.5 mg by mouth at bedtime.    Yes Historical Provider, MD  cyclobenzaprine (FLEXERIL) 5 MG tablet Take 5 mg by mouth daily as needed for muscle spasms. 08/12/15  Yes Historical Provider, MD  divalproex (DEPAKOTE ER) 500 MG 24 hr tablet Take 500 mg by mouth daily.   Yes Historical Provider, MD  DULERA 100-5 MCG/ACT AERO Inhale 2 puffs into the lungs daily as needed for wheezing.  06/15/15  Yes Historical Provider, MD  ibuprofen (ADVIL,MOTRIN) 800 MG tablet Take 800 mg by mouth every 8 (eight) hours as needed for moderate pain.  08/04/15  Yes Historical Provider, MD  Ibuprofen-Diphenhydramine HCl (ADVIL PM) 200-25 MG CAPS Take 1 capsule by mouth at bedtime as needed (sleep).   Yes Historical Provider, MD  meloxicam (MOBIC) 15 MG tablet Take 15 mg by mouth daily as needed for pain. 08/12/15  Yes Historical Provider, MD  metFORMIN (GLUCOPHAGE) 500 MG tablet Take 1 tablet (500 mg total) by mouth 2 (two) times daily with a meal. 06/20/15  Yes Ethelda Chick, MD  PARoxetine (PAXIL) 20 MG tablet Take 20 mg by mouth daily.   Yes Historical Provider, MD    Physical Exam: Vitals:   08/21/15 1126 08/21/15 1130 08/21/15 1145 08/21/15 1230  BP: 124/68 137/79 122/76 129/77  Pulse: 110 94 92 87  Resp: 20   18  Temp: 98.5 F (36.9 C)     TempSrc: Oral     SpO2: 99% 99% 95% 96%  Weight: (!) 205 kg (452 lb)     Height: 6\' 3"  (1.905 m)        General:  Appears calm, Almost lethargic somewhat uncomfortable. Able to reposition self in bed Eyes:  PERRL, EOMI, normal lids, iris ENT:  grossly normal hearing, lips & tongue, mucous membranes of his mouth are pink slightly dry Neck:  no LAD, masses or thyromegaly Cardiovascular:  RRR, no m/r/g. No LE edema.  Respiratory:  CTA bilaterally, no w/r/r. Normal respiratory effort. Abdomen:  soft, ntnd, obese bowel  sounds sluggish mild diffuse tenderness to palpation Skin:  no rash or induration seen on limited exam Musculoskeletal:  grossly normal tone BUE/BLE,  no bony abnormality Psychiatric:  grossly normal mood and affect, speech fluent and appropriate, AOx3 Neurologic:  Slightly lethargic oriented 3. Moves all extremities spontaneously but unable to perform straight leg lift either side, DTR bilateral LE , decrease sensation on left medial aspect lower leg  Labs on Admission: I have personally reviewed following labs and imaging studies  CBC:  Recent Labs Lab 08/21/15 1221 08/21/15 1232  WBC 5.4  --   NEUTROABS 3.3  --   HGB 13.1 12.9*  HCT 39.1 38.0*  MCV 82.1  --   PLT 184  --    Basic Metabolic  Panel:  Recent Labs Lab 08/21/15 1232  NA 141  K 3.6  CL 103  GLUCOSE 128*  BUN 14  CREATININE 0.80   GFR: Estimated Creatinine Clearance: 251.1 mL/min (by C-G formula based on SCr of 0.8 mg/dL). Liver Function Tests: No results for input(s): AST, ALT, ALKPHOS, BILITOT, PROT, ALBUMIN in the last 168 hours. No results for input(s): LIPASE, AMYLASE in the last 168 hours. No results for input(s): AMMONIA in the last 168 hours. Coagulation Profile: No results for input(s): INR, PROTIME in the last 168 hours. Cardiac Enzymes: No results for input(s): CKTOTAL, CKMB, CKMBINDEX, TROPONINI in the last 168 hours. BNP (last 3 results) No results for input(s): PROBNP in the last 8760 hours. HbA1C: No results for input(s): HGBA1C in the last 72 hours. CBG: No results for input(s): GLUCAP in the last 168 hours. Lipid Profile: No results for input(s): CHOL, HDL, LDLCALC, TRIG, CHOLHDL, LDLDIRECT in the last 72 hours. Thyroid Function Tests: No results for input(s): TSH, T4TOTAL, FREET4, T3FREE, THYROIDAB in the last 72 hours. Anemia Panel: No results for input(s): VITAMINB12, FOLATE, FERRITIN, TIBC, IRON, RETICCTPCT in the last 72 hours. Urine analysis:    Component Value Date/Time    BILIRUBINUR negative 06/20/2015 1852   BILIRUBINUR neg 08/13/2013 1203   KETONESUR negative 06/20/2015 1852   PROTEINUR negative 06/20/2015 1852   PROTEINUR neg 08/13/2013 1203   UROBILINOGEN 0.2 06/20/2015 1852   NITRITE Negative 06/20/2015 1852   NITRITE neg 08/13/2013 1203   LEUKOCYTESUR Negative 06/20/2015 1852    Creatinine Clearance: Estimated Creatinine Clearance: 251.1 mL/min (by C-G formula based on SCr of 0.8 mg/dL).  Sepsis Labs: @LABRCNTIP (procalcitonin:4,lacticidven:4) )No results found for this or any previous visit (from the past 240 hour(s)).   Radiological Exams on Admission: No results found.  EKG: none  Assessment/Plan Principal Problem:   Intractable low back pain Active Problems:   BMI 50.0-59.9, adult (HCC)   Anxiety state   TIA (transient ischemic attack)   Diabetes mellitus without complication (HCC)   Lumbar radiculopathy   OSA (obstructive sleep apnea)   #1. Intractable low back pain/lumbar radiculopathy. Reported mechanical fall 4 days ago setting of low back soreness after caring laundry up the steps.. No incontinence of bowel or bladder. Unable to do straight leg left on the left but is able to reposition himself quite ably in the bed. Scheduled for MRI today been unable to fit properly and/or lie flat. Radiology consulted indicated [facility] with open MRI. Case discussed with ortho who recommended admission for pain control and imaging when able. Will follow -Admit to medical floor -Toradol -Low-dose short-term Dilaudid -Flexeril -Physical therapy  2. Diabetes. Prediabetes. Not on any medications. Serum glucose 128 -Monitor -Obtain a hemoglobin A1c  #3. Morbid obesity. BMI 56.6. -Nutritional consult  4. TIA. Last month experienced left arm numbness tingling. Admitted for TIA workup. MRI of the brain normal. Workup unremarkable her patient. No records. Hospitalized at how point. Chart review indicates he did have a cardiac event monitor. Note  indicates some SVT, up appointment next week  #5. Anxiety state/mood disorder. Appears stable at baseline -Continue home meds     DVT prophylaxis: scd  Code Status: full  Family Communication: mother at bedside  Disposition Plan: home  Consults called: ortho [place] Admission status: obs   Toya SmothersBLACK,Tricia Pledger M MD [facility]  If 7PM-7AM, please contact night-coverage [email or web address] Password [email or web address]  08/21/2015, 3:28 PM

## 2015-08-21 NOTE — ED Notes (Signed)
Pt returned from MRI.  MRI not done as pt unable to fit appropriately in the MRI machine. MD made aware. Verbal order obtained for pain med.

## 2015-08-21 NOTE — ED Notes (Signed)
Patient transported to MRI 

## 2015-08-21 NOTE — ED Provider Notes (Signed)
[facility]-EMERGENCY DEPT Provider Note   CSN: [id number] Arrival date & time: 08/21/15  1119  First Provider Contact:  First MD Initiated Contact with Patient 08/21/15 1133        History   Chief Complaint Chief Complaint  Patient presents with  . Back Pain    HPI Brandon Morales is a 30 y.o. male.Plains of low back pain rating to both feet onset 2 weeks ago. He denies any loss of bladder or bowel control. Denies fever. No other associated symptoms. He's been unable to get to the bathroom, urinating until bottle. He's been getting around his home by sitting on a chair with wheels on it. He has been prescribed Tylenol 3 with partial relief. He has run out of Tylenol 3, the past few days has been taking Advil, without adequate pain relief. He was to have an MRI as an outpatient, however he was unable to lie flat due to too much pain. Pain worse with movement improved with remaining still. No other associated symptoms  HPI  Past Medical History:  Diagnosis Date  . Anxiety   . Asthma   . Depression   . Diabetes mellitus without complication (HCC)   . Hyperglycemia   . TIA (transient ischemic attack) 07/20/2015    Patient Active Problem List   Diagnosis Date Noted  . Generalized anxiety disorder 07/26/2015    Class: Chronic  . Persistent mood (affective) disorder, unspecified (HCC) 07/26/2015    Class: Chronic  . TIA (transient ischemic attack) 07/20/2015  . Mood disorder (HCC) 07/04/2015  . Anxiety state 07/04/2015  . BMI 50.0-59.9, adult (HCC) 07/20/2011  . Asthma 03/11/2011  . Obesity 03/11/2011    Past Surgical History:  Procedure Laterality Date  . TEE WITHOUT CARDIOVERSION N/A 07/28/2015   Procedure: TRANSESOPHAGEAL ECHOCARDIOGRAM (TEE);  Surgeon: Pricilla Riffle, MD;  Location: [facility] ENDOSCOPY;  Service: Cardiovascular;  Laterality: N/A;  WITH BUBBLE  . TONSILLECTOMY         Home Medications    Prior to Admission medications   Medication Sig Start Date End Date  Taking? Authorizing Provider  acetaminophen-codeine (TYLENOL #3) 300-30 MG tablet  08/10/15   Historical Provider, MD  albuterol (PROVENTIL HFA;VENTOLIN HFA) 108 (90 BASE) MCG/ACT inhaler Inhale 2 puffs into the lungs every 6 (six) hours as needed. 07/20/11   Tonye Pearson, MD  albuterol (PROVENTIL) (2.5 MG/3ML) 0.083% nebulizer solution Take 3 mLs (2.5 mg total) by nebulization every 6 (six) hours as needed. 07/20/11   Tonye Pearson, MD  ALPRAZolam Prudy Feeler) 0.5 MG tablet Take 0.5 mg by mouth 2 (two) times daily.    Historical Provider, MD  aspirin 81 MG tablet Take 81 mg by mouth daily.    Historical Provider, MD  atorvastatin (LIPITOR) 80 MG tablet  06/29/15   Historical Provider, MD  divalproex (DEPAKOTE ER) 500 MG 24 hr tablet Take 500 mg by mouth daily.    Historical Provider, MD  DULERA 100-5 MCG/ACT AERO  06/15/15   Historical Provider, MD  ibuprofen (ADVIL,MOTRIN) 800 MG tablet  08/04/15   Historical Provider, MD  metFORMIN (GLUCOPHAGE) 500 MG tablet Take 1 tablet (500 mg total) by mouth 2 (two) times daily with a meal. 06/20/15   Ethelda Chick, MD  PARoxetine (PAXIL) 20 MG tablet Take 20 mg by mouth daily.    Historical Provider, MD    Family History Family History  Problem Relation Age of Onset  . Diabetes Mother   . Hypertension Mother   . Depression  Mother   . Diabetes Maternal Grandmother     Social History Social History  Substance Use Topics  . Smoking status: Former Smoker    Quit date: 03/10/2008  . Smokeless tobacco: Never Used  . Alcohol use No     Allergies   Erythromycin and Zithromax [azithromycin dihydrate]   Review of Systems Review of Systems  Constitutional: Negative.   HENT: Negative.   Respiratory: Negative.   Cardiovascular: Negative.   Gastrointestinal: Negative.   Musculoskeletal: Positive for back pain.  Skin: Negative.   Neurological: Negative.   Psychiatric/Behavioral: Negative.   All other systems reviewed and are  negative.    Physical Exam Updated Vital Signs BP 124/68   Pulse 110   Temp 98.5 F (36.9 C) (Oral)   Resp 20   Ht  (1.905 m)   Wt (!) 452 lb (205 kg)   SpO2 99%   BMI 56.50 kg/m   Physical Exam  Constitutional: He appears well-developed and well-nourished. He appears distressed.  Appears uncomfortable  HENT:  Head: Normocephalic and atraumatic.  Eyes: Conjunctivae are normal. Pupils are equal, round, and reactive to light.  Neck: Neck supple. No tracheal deviation present. No thyromegaly present.  Cardiovascular: Normal rate and regular rhythm.   No murmur heard. Pulmonary/Chest: Effort normal and breath sounds normal.  Abdominal: Soft. Bowel sounds are normal. He exhibits no distension. There is no tenderness.  Morbidly obese  Musculoskeletal: Normal range of motion. He exhibits no edema or tenderness.  Tender over lower lumbar spine  Neurological: He is alert. He displays normal reflexes. No cranial nerve deficit. Coordination normal.  Moves all extremities DTR symmetric bilaterally at knee jerk ankle jerk and biceps toes downward going bilaterally  Skin: Skin is warm and dry. No rash noted.  Psychiatric: He has a normal mood and affect.  Nursing note and vitals reviewed.    ED Treatments / Results  Labs (all labs ordered are listed, but only abnormal results are displayed) Labs Reviewed  CBC WITH DIFFERENTIAL/PLATELET  I-STAT CHEM 8, ED    EKG  EKG Interpretation None       Radiology No results found.  Procedures Procedures (including critical care time)  Medications Ordered in ED Medications  HYDROmorphone (DILAUDID) injection 1 mg (not administered)     Initial Impression / Assessment and Plan / ED Course  I have reviewed the triage vital signs and the nursing notes.  Pertinent labs & imaging results that were available during my care of the patient were reviewed by me and considered in my medical decision making (see chart for  details).  Clinical Course  12:35 PM requesting additional pain medicine after treatment with intravenous hydromorphone. Additional IV hydromorphone ordered. 2:50 PM pain is improved after treatment with additional IV hydromorphone. I consulted [facility] and spoke with Dr. Jerl Santos. He will be seen tomorrow. He requests hospitalist service to admit patient. I consulted hospitalist service Plan 23 hour observation MedSurg floor. Patient could not tolerate being place an MRI scan due to pain. Also MRI scan on this Is could not accommodate patient Results for orders placed or performed during the hospital encounter of 08/21/15  CBC with Differential/Platelet  Result Value Ref Range   WBC 5.4 4.0 - 10.5 K/uL   RBC 4.76 4.22 - 5.81 MIL/uL   Hemoglobin 13.1 13.0 - 17.0 g/dL   HCT 40.9 81.1 - 91.4 %   MCV 82.1 78.0 - 100.0 fL   MCH 27.5 26.0 - 34.0 pg  MCHC 33.5 30.0 - 36.0 g/dL   RDW 16.113.5 09.611.5 - 04.515.5 %   Platelets 184 150 - 400 K/uL   Neutrophils Relative % 60 %   Neutro Abs 3.3 1.7 - 7.7 K/uL   Lymphocytes Relative 26 %   Lymphs Abs 1.4 0.7 - 4.0 K/uL   Monocytes Relative 9 %   Monocytes Absolute 0.5 0.1 - 1.0 K/uL   Eosinophils Relative 4 %   Eosinophils Absolute 0.2 0.0 - 0.7 K/uL   Basophils Relative 1 %   Basophils Absolute 0.0 0.0 - 0.1 K/uL  I-stat chem 8, ed  Result Value Ref Range   Sodium 141 135 - 145 mmol/L   Potassium 3.6 3.5 - 5.1 mmol/L   Chloride 103 101 - 111 mmol/L   BUN 14 6 - 20 mg/dL   Creatinine, Ser 4.090.80 0.61 - 1.24 mg/dL   Glucose, Bld 811128 (H) 65 - 99 mg/dL   Calcium, Ion 9.141.05 (L) 1.13 - 1.30 mmol/L   TCO2 24 0 - 100 mmol/L   Hemoglobin 12.9 (L) 13.0 - 17.0 g/dL   HCT 78.238.0 (L) 95.639.0 - 21.352.0 %   Mr Brain Wo Contrast  Result Date: 08/04/2015  [facility] [address], [address] [place], [place] [address] [phone] NEUROIMAGING REPORT STUDY DATE: 07/29/2015 PATIENT NAME: Clent JacksBenjamin Peddy DOB: 02/29/1985 MRN: [id number] EXAM:  MRI Brain without contrast ORDERING CLINICIAN: York Spanielharles K Willis M.D. 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  T1 sagittal, T2 coronal  were also obtained. CONTRAST: none IMAGING SITE: [facility], [address], [place], [place] [address] FINDINGS: On sagittal images, the spinal cord is imaged caudally to C3 and is normal in caliber.   The contents of the posterior fossa are of normal size and position.   The pituitary gland and optic chiasm appear normal.    Brain volume appears normal.   The ventricles are normal in size and without distortion.  There are no abnormal extra-axial collections of fluid.  The cerebellum and brainstem appears normal.   The deep gray matter appears normal.  The cerebral hemispheres appear normal.  Diffusion weighted images are normal.  Gradient echo heme weighted images are normal. The orbits appear normal.   The VIIth/VIIIth nerve complex appears normal.  The mastoid air cells appear normal.  The paranasal sinuses appear normal.  Flow voids are identified within the major intracerebral arteries.        This is a normal MRI of the brain without contrast. INTERPRETING PHYSICIAN: Richard A. Epimenio FootSater, MD, PhD Certified in  Neuroimaging by American Society of Neuroimaging   Final Clinical Impressions(s) / ED Diagnoses  Diagnosis lumbar radiculopathy with intractable pain Final diagnoses:  None    New Prescriptions New Prescriptions   No medications on file     Doug SouSam Zianne Schubring, MD 08/21/15 1455

## 2015-08-21 NOTE — Consult Note (Signed)
Marcene CorningPeter Zikeria Keough, MD  Bryna ColanderMike Carnaghi, PA-C  Elodia FlorenceAndrew Nida, PA-C                                  [facility]                [address], [place], [place]  [id number]   ORTHOPAEDIC CONSULTATION  Brandon JacksBenjamin Morales            MRN:  [id number] DOB/SEX:  04/09/1985/male     CHIEF COMPLAINT:  Painful back and legs  HISTORY: Brandon GerlachBenjamin Woolfendenis a 30 y.o. male with worsening LBP and leg pain bilat right greater than left.  Also numb in right foot.  Denies B/B incontinence.  Seen in office a week ago and MRI recommended but got worse this weekend and presented to ED.  Admitted by medicine and ORS consulted.  No history of back surgery   PAST MEDICAL HISTORY: Patient Active Problem List   Diagnosis Date Noted  . Intractable low back pain 08/21/2015  . Lumbar radiculopathy 08/21/2015  . Intractable back pain 08/21/2015  . Diabetes mellitus without complication (HCC)   . OSA (obstructive sleep apnea)   . Generalized anxiety disorder 07/26/2015    Class: Chronic  . Persistent mood (affective) disorder, unspecified (HCC) 07/26/2015    Class: Chronic  . TIA (transient ischemic attack) 07/20/2015  . Mood disorder (HCC) 07/04/2015  . Anxiety state 07/04/2015  . BMI 50.0-59.9, adult (HCC) 07/20/2011  . Asthma 03/11/2011  . Obesity 03/11/2011   Past Medical History:  Diagnosis Date  . Anxiety   . Asthma   . Depression   . Diabetes mellitus without complication (HCC)   . Hyperglycemia   . OSA (obstructive sleep apnea)   . TIA (transient ischemic attack) 07/20/2015   Past Surgical History:  Procedure Laterality Date  . TEE WITHOUT CARDIOVERSION N/A 07/28/2015   Procedure: TRANSESOPHAGEAL ECHOCARDIOGRAM (TEE);  Surgeon: Pricilla RifflePaula V Ross, MD;  [facility];  Service: Cardiovascular;  Laterality: N/A;  WITH BUBBLE  . TONSILLECTOMY       MEDICATIONS:   Current Facility-Administered Medications:  .  0.9 %  sodium chloride infusion, , Intravenous, Continuous, Gwenyth BenderKaren M Black,  NP, Last Rate: 75 mL/hr at 08/21/15 1627 .  acetaminophen (TYLENOL) tablet 650 mg, 650 mg, Oral, Q6H PRN **OR** acetaminophen (TYLENOL) suppository 650 mg, 650 mg, Rectal, Q6H PRN, Lesle ChrisKaren M Black, NP .  acetaminophen-codeine (TYLENOL #3) 300-30 MG per tablet 1-2 tablet, 1-2 tablet, Oral, Q6H PRN, Gwenyth BenderKaren M Black, NP, 2 tablet at 08/21/15 2005 .  albuterol (PROVENTIL HFA;VENTOLIN HFA) 108 (90 Base) MCG/ACT inhaler 2 puff, 2 puff, Inhalation, Q6H PRN, Lesle ChrisKaren M Black, NP .  albuterol (PROVENTIL) (2.5 MG/3ML) 0.083% nebulizer solution 2.5 mg, 2.5 mg, Nebulization, Q6H PRN, Gwenyth BenderKaren M Black, NP .  ALPRAZolam Prudy Feeler(XANAX) tablet 0.5 mg, 0.5 mg, Oral, QHS, Lesle ChrisKaren M Black, NP, 0.5 mg at 08/21/15 2005 .  cyclobenzaprine (FLEXERIL) tablet 5 mg, 5 mg, Oral, Daily PRN, Gwenyth BenderKaren M Black, NP, 5 mg at 08/21/15 1626 .  divalproex (DEPAKOTE ER) 24 hr tablet 500 mg, 500 mg, Oral, Daily, Lesle ChrisKaren M Black, NP, 500 mg at 08/21/15 2000 .  enoxaparin (LOVENOX) injection 40 mg, 40 mg, Subcutaneous, Q24H, Lesle ChrisKaren M Black, NP, 40 mg at 08/21/15 1848 .  HYDROmorphone (DILAUDID) injection 0.5 mg, 0.5 mg, Intravenous, Q4H PRN, Gwenyth BenderKaren M Black, NP, 0.5 mg at 08/21/15 1848 .  insulin aspart (novoLOG) injection  0-5 Units, 0-5 Units, Subcutaneous, QHS, Lesle Chris Black, NP .  Melene Muller ON 08/22/2015] insulin aspart (novoLOG) injection 0-9 Units, 0-9 Units, Subcutaneous, TID WC, Lesle Chris Black, NP .  ketorolac (TORADOL) 15 MG/ML injection 15 mg, 15 mg, Intravenous, Q6H PRN, Gwenyth Bender, NP, 15 mg at 08/21/15 1626 .  mometasone-formoterol (DULERA) 100-5 MCG/ACT inhaler 2 puff, 2 puff, Inhalation, BID, Lesle Chris Black, NP .  ondansetron [facility]) tablet 4 mg, 4 mg, Oral, Q6H PRN **OR** ondansetron (ZOFRAN) injection 4 mg, 4 mg, Intravenous, Q6H PRN, Gwenyth Bender, NP .  PARoxetine (PAXIL) tablet 20 mg, 20 mg, Oral, Daily, Lesle Chris Black, NP, 20 mg at 08/21/15 1626 .  polyethylene glycol (MIRALAX / GLYCOLAX) packet 17 g, 17 g, Oral, Daily PRN, Gwenyth Bender,  NP  ALLERGIES:   Allergies  Allergen Reactions  . Erythromycin     Hives, itching   . Zithromax [Azithromycin Dihydrate]     unknown    REVIEW OF SYSTEMS: REVIEWED IN DETAIL IN CHART  FAMILY HISTORY:   Family History  Problem Relation Age of Onset  . Diabetes Mother   . Hypertension Mother   . Depression Mother   . Diabetes Maternal Grandmother     SOCIAL HISTORY:   Social History  Substance Use Topics  . Smoking status: Former Smoker    Quit date: 03/10/2008  . Smokeless tobacco: Never Used  . Alcohol use No     EXAMINATION: Vital signs in last 24 hours: Temp:  [97.6 F (36.4 C)-98.5 F (36.9 C)] 97.6 F (36.4 C) (08/06 2050) Pulse Rate:  [66-110] 83 (08/06 2050) Resp:  [16-20] 16 (08/06 2050) BP: (122-157)/(68-95) 134/77 (08/06 2050) SpO2:  [95 %-99 %] 96 % (08/06 2050) Weight:  [205 kg (452 lb)] 205 kg (452 lb) (08/06 1126)  BP 134/77 (BP Location: Right Arm)   Pulse 83   Temp 97.6 F (36.4 C) (Oral)   Resp 16   Ht  (1.905 m)   Wt (!) 205 kg (452 lb)   SpO2 96%   BMI 56.50 kg/m   General Appearance:    Alert, cooperative, no distress, appears stated age  Head:    Normocephalic, without obvious abnormality, atraumatic  Eyes:    PERRL, conjunctiva/corneas clear, EOM's intact, fundi    benign, both eyes       Ears:    Normal TM's and external ear canals, both ears  Nose:   Nares normal, septum midline, mucosa normal, no drainage    or sinus tenderness  Throat:   Lips, mucosa, and tongue normal; teeth and gums normal  Neck:   Supple, symmetrical, trachea midline, no adenopathy;       thyroid:  No enlargement/tenderness/nodules; no carotid   bruit or JVD  Back:     Symmetric, no curvature, ROM normal, no CVA tenderness  Lungs:     Clear to auscultation bilaterally, respirations unlabored  Chest wall:    No tenderness or deformity  Heart:    Regular rate and rhythm, S1 and S2 normal, no murmur, rub   or gallop  Abdomen:     Soft, non-tender, bowel  sounds active all four quadrants,    no masses, no organomegaly  Genitalia:    Rectal:    Extremities:   Extremities normal, atraumatic, no cyanosis or edema  Pulses:   2+ and symmetric all extremities  Skin:   Skin color, texture, turgor normal, no rashes or lesions  Lymph nodes:   Cervical,  supraclavicular, and axillary nodes normal  Neurologic:   CNII-XII intact. Normal strength, sensation and reflexes      throughout     Musculoskeletal Exam:   SLR painful.  Strength seems normal in L4-S1. Pulses palp and good hip ROM   DIAGNOSTIC STUDIES: Recent laboratory studies: Recent Labs  08/21/15 1221 08/21/15 1232  WBC 5.4  --   HGB 13.1 12.9*  HCT 39.1 38.0*  PLT 184  --     Recent Labs  08/21/15 1232  NA 141  K 3.6  CL 103  BUN 14  CREATININE 0.80  GLUCOSE 128*   Lab Results  Component Value Date   INR 1.0 07/25/2015     Recent Radiographic Studies :  Mr Brain Wo Contrast  Result Date: 08/04/2015  [facility] [address], [address] [place], [place] [address] [phone] NEUROIMAGING REPORT STUDY DATE: 07/29/2015 PATIENT NAME: Dalante Minus DOB: Nov 17, 1985 MRN: [id number] EXAM: MRI Brain without contrast ORDERING CLINICIAN: York Spaniel M.D. 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  T1 sagittal, T2 coronal  were also obtained. CONTRAST: none IMAGING SITE: [facility], [address] [place], [place] [address] FINDINGS: On sagittal images, the spinal cord is imaged caudally to C3 and is normal in caliber.   The contents of the posterior fossa are of normal size and position.   The pituitary gland and optic chiasm appear normal.    Brain volume appears normal.   The ventricles are normal in size and without  distortion.  There are no abnormal extra-axial collections of fluid.  The cerebellum and brainstem appears normal.   The deep gray matter appears normal.  The cerebral hemispheres appear normal.  Diffusion weighted images are normal.  Gradient echo heme weighted images are normal. The orbits appear normal.   The VIIth/VIIIth nerve complex appears normal.  The mastoid air cells appear normal.  The paranasal sinuses appear normal.  Flow voids are identified within the major intracerebral arteries.        This is a normal MRI of the brain without contrast. INTERPRETING PHYSICIAN: Richard A. Epimenio Foot, MD, PhD Certified in  Neuroimaging by American Society of Neuroimaging    ASSESSMENT:  LBP and probable radiculopathy   PLAN:  Plan is same for MRI.  May need sedation.  Dr Yevette Edwards to see tomorrow.  Patient seems comfortable currently.  Kresha Abelson G 08/21/2015, 8:55 PM

## 2015-08-22 ENCOUNTER — Observation Stay (HOSPITAL_COMMUNITY): Payer: BC Managed Care – PPO

## 2015-08-22 ENCOUNTER — Other Ambulatory Visit (HOSPITAL_COMMUNITY): Payer: BC Managed Care – PPO

## 2015-08-22 DIAGNOSIS — Z6841 Body Mass Index (BMI) 40.0 and over, adult: Secondary | ICD-10-CM

## 2015-08-22 DIAGNOSIS — M545 Low back pain: Secondary | ICD-10-CM

## 2015-08-22 DIAGNOSIS — F411 Generalized anxiety disorder: Secondary | ICD-10-CM | POA: Diagnosis not present

## 2015-08-22 DIAGNOSIS — G4733 Obstructive sleep apnea (adult) (pediatric): Secondary | ICD-10-CM

## 2015-08-22 LAB — GLUCOSE, CAPILLARY
GLUCOSE-CAPILLARY: 118 mg/dL — AB (ref 65–99)
GLUCOSE-CAPILLARY: 164 mg/dL — AB (ref 65–99)
GLUCOSE-CAPILLARY: 162 mg/dL — AB (ref 65–99)
Glucose-Capillary: 122 mg/dL — ABNORMAL HIGH (ref 65–99)

## 2015-08-22 LAB — HEMOGLOBIN A1C
Hgb A1c MFr Bld: 8.5 % — ABNORMAL HIGH (ref 4.8–5.6)
MEAN PLASMA GLUCOSE: 197 mg/dL

## 2015-08-22 MED ORDER — ENOXAPARIN SODIUM 120 MG/0.8ML ~~LOC~~ SOLN
0.5000 mg/kg | SUBCUTANEOUS | Status: DC
  Administered 2015-08-22: 100 mg via SUBCUTANEOUS
  Filled 2015-08-22 (×2): qty 0.68

## 2015-08-22 NOTE — Progress Notes (Addendum)
Patient ID: Brandon Morales, male   DOB: 1985-01-26, 30 y.o.   MRN: [id number]  PROGRESS NOTE    Brandon Morales  XBJ:[id number] DOB: January 31, 1985 DOA: 08/21/2015  PCP: Paulino Rily, MD   Brief Narrative:  30 y.o. male with past medical history significant for morbid obesity, obstructive sleep apnea, anxiety, recent workup for CEA who presented to [facility] with intractable low back pain. Apparently patient fell 4 days ago at home and not able to get up from the floor. Patient to Tylenol No. 3 with partial symptomatic relief. He was scheduled for MRI as an outpatient as he could not lay down flat due to intractable pain. Patient also reported associated numbness in left knee. Denies numbness or tingling in other areas and no sensation loss. Patient admitted for pain management.  Assessment & Plan:   Principal Problem:   Intractable low back pain / Morbid obesity due to excess calories - Unclear etiology. Patient is morbidly obese and weight of 452 pounds, unable to fit into CT or MRI scan - We'll obtain x-ray study - Continue pain management efforts - Physical therapy evaluation  Active Problems:   Diabetes mellitus without complication without long term insulin use (HCC) - Continue SSI    Anxiety and depression - Continue Xanax and Paxil    Obstructive sleep apnea - Stable respiratory status   DVT prophylaxis: Lovenox subcutaneous Code Status: full code  Family Communication: No family at the bedside Disposition Plan: unclear at this point, will be seen by PT and will follow up on their recommendations    Consultants:   PT   Procedures:   None   Antimicrobials:   None    Subjective: No overnight events.   Objective: Vitals:   08/21/15 1545 08/21/15 1606 08/21/15 2050 08/22/15 0645  BP: 142/70 (!) 157/95 134/77 119/80  Pulse: 66 78 83 79  Resp:  Temp:  98.4 F (36.9 C) 97.6 F (36.4 C) 98.6 F (37 C)  TempSrc:  Oral Oral Oral    SpO2: 96% 97% 96% 98%  Weight:      Height:        Intake/Output Summary (Last 24 hours) at 08/22/15 1041 Last data filed at 08/22/15 0830  Gross per 24 hour  Intake              120 ml  Output                0 ml  Net              120 ml   Filed Weights   08/21/15 1126  Weight: (!) 205 kg (452 lb)    Examination:  General exam: Appears calm and comfortable  Respiratory system: Clear to auscultation. Respiratory effort normal. Cardiovascular system: S1 & S2 heard, RRR. No JVD, murmurs, rubs, gallops or clicks. No pedal edema. Gastrointestinal system: Abdomen is obese, soft and nontender. No organomegaly or masses felt. Normal bowel sounds heard. Central nervous system: Alert and oriented. No focal neurological deficits. Extremities: Symmetric 5 x 5 power. Skin: No rashes, lesions or ulcers Psychiatry: Judgement and insight appear normal. Mood & affect appropriate.   Data Reviewed: I have personally reviewed following labs and imaging studies  CBC:  Recent Labs Lab 08/21/15 1221 08/21/15 1232  WBC 5.4  --   NEUTROABS 3.3  --   HGB 13.1 12.9*  HCT 39.1 38.0*  MCV 82.1  --   PLT 184  --  Basic Metabolic Panel:  Recent Labs Lab 08/21/15 1232  NA 141  K 3.6  CL 103  GLUCOSE 128*  BUN 14  CREATININE 0.80   GFR: Estimated Creatinine Clearance: 251.1 mL/min (by C-G formula based on SCr of 0.8 mg/dL). Liver Function Tests: No results for input(s): AST, ALT, ALKPHOS, BILITOT, PROT, ALBUMIN in the last 168 hours. No results for input(s): LIPASE, AMYLASE in the last 168 hours. No results for input(s): AMMONIA in the last 168 hours. Coagulation Profile: No results for input(s): INR, PROTIME in the last 168 hours. Cardiac Enzymes: No results for input(s): CKTOTAL, CKMB, CKMBINDEX, TROPONINI in the last 168 hours. BNP (last 3 results) No results for input(s): PROBNP in the last 8760 hours. HbA1C:  Recent Labs  08/21/15 1221  HGBA1C 8.5*   CBG:  Recent  Labs Lab 08/21/15 1644 08/21/15 2052 08/22/15 0648  GLUCAP 130* 182* 118*   Lipid Profile: No results for input(s): CHOL, HDL, LDLCALC, TRIG, CHOLHDL, LDLDIRECT in the last 72 hours. Thyroid Function Tests: No results for input(s): TSH, T4TOTAL, FREET4, T3FREE, THYROIDAB in the last 72 hours. Anemia Panel: No results for input(s): VITAMINB12, FOLATE, FERRITIN, TIBC, IRON, RETICCTPCT in the last 72 hours. Urine analysis:    Component Value Date/Time   BILIRUBINUR negative 06/20/2015 1852   BILIRUBINUR neg 08/13/2013 1203   KETONESUR negative 06/20/2015 1852   PROTEINUR negative 06/20/2015 1852   PROTEINUR neg 08/13/2013 1203   UROBILINOGEN 0.2 06/20/2015 1852   NITRITE Negative 06/20/2015 1852   NITRITE neg 08/13/2013 1203   LEUKOCYTESUR Negative 06/20/2015 1852   Sepsis Labs: @LABRCNTIP (procalcitonin:4,lacticidven:4)   )No results found for this or any previous visit (from the past 240 hour(s)).    Radiology Studies: No results found.   Scheduled Meds: . ALPRAZolam  0.5 mg Oral QHS  . divalproex  500 mg Oral Daily  . enoxaparin (LOVENOX) injection  0.5 mg/kg Subcutaneous Q24H  . insulin aspart  0-5 Units Subcutaneous QHS  . insulin aspart  0-9 Units Subcutaneous TID WC  . mometasone-formoterol  2 puff Inhalation BID  . PARoxetine  20 mg Oral Daily   Continuous Infusions:    LOS: 0 days    Time spent: 25 minutes  Greater than 50% of the time spent on counseling and coordinating the care.   Manson PasseyEVINE, [place], MD [facility] Pager [phone]  If 7PM-7AM, please contact night-coverage [email or web address] Password [email or web address] 08/22/2015, 10:41 AM

## 2015-08-22 NOTE — Progress Notes (Signed)
ATTEMPTED PT ON 08/21/15 AND ORDER WAS CANCELLED DUE TO PT NOT BEING ABLE TO FIT IN SCANNER, CONTACTED RN AND NOTIFIED RN THAT THERE WAS AN ATTEMPT TO CONTACT PA THAT ORDERED EXAM AGAIN AND WE DID NOT GET AN ANSWER, RN STATED THAT SHE WOULD TRY TO CONTACT PA

## 2015-08-22 NOTE — Progress Notes (Cosign Needed)
    Patient was seen in the office last week and MRI was recommended. Unfortunately he sustained a fall at home and had increased pain in R leg causing him to get admitted to hospital for pain control. He was unable to obtain MRI secondary to pain and not being able to lay still. MRI with sedation has been ordered but not yet obtained. He continues to describe left leg pain particularly in the lateral lower leg. He continues to describe severe LBP. He states the meds have been minimally beneficial. He states he does not think he will ever go back to his prior job.    Physical Exam: Vitals:   08/21/15 2050 08/22/15 0645  BP: 134/77 119/80  Pulse: 83 79  Resp: 16 16  Temp: 97.6 F (36.4 C) 98.6 F (37 C)   Pt is resting comfortably in bed, strength is full in BIL LE's however he has substantial pain with MMT and normal bowel and bladder function.   R>L leg pain greater than LBP likely radicular in nature in an obese male pending open MRI with sedation as unable to tolerate regular MRI   - encourage ambulation and activity as tolerated  -PT for radiculopathy  - Neurologically intact  - Continue current pain management  - MRI with sedation, open if needed due to size

## 2015-08-22 NOTE — Progress Notes (Signed)
    [facility] due to body habitus. He is neurologocally intact and stable. He will have to obtain MRI at a facility that can accommodate him and his body habitus. Social work may need to get involved to help arrange this. He is neurologically stable and we can f/u on results as outpatient if needed. OK to be discharged from orthopaedic standpoint when medical team feels appropriate. Pt not currently a surgical candidate.            Jason CoopKayla Dhillon [place], PA-C                                            Estill BambergMark Dumonski, MD

## 2015-08-22 NOTE — Evaluation (Signed)
Physical Therapy Evaluation Patient Details Name: Brandon Morales MRN: [id number] DOB: 12/22/1985 Today's Date: 08/22/2015   History of Present Illness  pt presents with back pain.  pt with TIA, Anxiety, and OSA.    Clinical Impression  Pt overall mobility very limited by pain in Bil knees, shins, ankles, and back.  Pt able to get to sitting at EOB, but indicating too painful to stand and multiple times stating he is unable to walk.  Throughout session pt making multiple comments about "getting Disability" and praying that if "God is gracious enough to grant me Disability".  Attempted to encourage pt to mobilize, however pt indicates he is in too much pain despite receiving pain meds ~1hr prior to PT's arrival.  Pt indicates he lives with his mother, but unsure the level of A she is able to provide him at D/C.  Pt may need SNF level of care unless he is better able to mobilize.  Will continue to follow.      Follow Up Recommendations SNF    Equipment Recommendations  Rolling walker with 5" wheels;3in1 (PT);Wheelchair (measurements PT);Wheelchair cushion (measurements PT)    Recommendations for Other Services       Precautions / Restrictions Precautions Precautions: Fall;Back Precaution Booklet Issued: No Precaution Comments: Educated on back precautions.  Restrictions Weight Bearing Restrictions: No      Mobility  Bed Mobility Overal bed mobility: Needs Assistance;+2 for physical assistance Bed Mobility: Rolling;Sidelying to Sit;Sit to Sidelying Rolling: Min assist;+2 for physical assistance Sidelying to sit: Min assist;+2 for physical assistance     Sit to sidelying: Min assist;+2 for physical assistance General bed mobility comments: pt needs A for all aspects of mobility and indicates pain in bil LEs and back throughout mobility.    Transfers                 General transfer comment: pt declined due to pain in Bil LEs.  Ambulation/Gait                 Stairs            Wheelchair Mobility    Modified Rankin (Stroke Patients Only)       Balance Overall balance assessment: Needs assistance Sitting-balance support: No upper extremity supported;Feet supported Sitting balance-Leahy Scale: Fair Sitting balance - Comments: pt able to maintain sitting balance without A, however pt indicates positioning in sitting causing increased pain in Bil LEs.                                       Pertinent Vitals/Pain Pain Assessment: 0-10 Pain Score: 10-Worst pain ever Pain Location: Bil knees, shins, ankles, and back Pain Descriptors / Indicators: Aching;Constant;Grimacing;Guarding Pain Intervention(s): Limited activity within patient's tolerance;Monitored during session;Premedicated before session;Repositioned    Home Living Family/patient expects to be discharged to:: Private residence Living Arrangements: Parent Available Help at Discharge: Family;Available 24 hours/day Type of Home: House Home Access: Stairs to enter Entrance Stairs-Rails: Doctor, general practiceight;Left Entrance Stairs-Number of Steps: 3 Home Layout: One level Home Equipment: Crutches      Prior Function Level of Independence: Independent         Comments: pt states independent, but also mentions his mother helps him.  pt had been working as a Corporate treasurerCorrections Officer, but unclear how long he has been out of the job.       Hand Dominance  Extremity/Trunk Assessment   Upper Extremity Assessment: Generalized weakness           Lower Extremity Assessment: Generalized weakness (Overall limited by pain.)      Cervical / Trunk Assessment: Kyphotic  Communication   Communication: No difficulties  Cognition Arousal/Alertness: Awake/alert Behavior During Therapy: WFL for tasks assessed/performed Overall Cognitive Status: Within Functional Limits for tasks assessed                      General Comments      Exercises         Assessment/Plan    PT Assessment Patient needs continued PT services  PT Diagnosis Difficulty walking;Acute pain;Generalized weakness   PT Problem List Decreased strength;Decreased activity tolerance;Decreased balance;Decreased range of motion;Decreased mobility;Decreased coordination;Decreased knowledge of use of DME;Decreased knowledge of precautions;Obesity;Pain  PT Treatment Interventions DME instruction;Gait training;Stair training;Functional mobility training;Therapeutic activities;Therapeutic exercise;Balance training;Patient/family education   PT Goals (Current goals can be found in the Care Plan section) Acute Rehab PT Goals Patient Stated Goal: To Volunteer once he gets on disability. PT Goal Formulation: With patient Time For Goal Achievement: 09/05/15 Potential to Achieve Goals: Good    Frequency Min 3X/week   Barriers to discharge        Co-evaluation               End of Session Equipment Utilized During Treatment: Gait belt Activity Tolerance: Patient limited by pain Patient left: in bed;with call bell/phone within reach;with bed alarm set Nurse Communication: Mobility status (Need for Bariatric equipment)    Functional Assessment Tool Used: Clinical Judgement Functional Limitation: Mobility: Walking and moving around Mobility: Walking and Moving Around Current Status (B1478): At least 20 percent but less than 40 percent impaired, limited or restricted Mobility: Walking and Moving Around Goal Status [phone]): 0 percent impaired, limited or restricted    Time: 1308-6578 PT Time Calculation (min) (ACUTE ONLY): 15 min   Charges:   PT Evaluation $PT Eval Moderate Complexity: 1 Procedure     PT G Codes:   PT G-Codes **NOT FOR INPATIENT CLASS** Functional Assessment Tool Used: Clinical Judgement Functional Limitation: Mobility: Walking and moving around Mobility: Walking and Moving Around Current Status (I6962): At least 20 percent but less than 40  percent impaired, limited or restricted Mobility: Walking and Moving Around Goal Status [phone]): 0 percent impaired, limited or restricted    Sunny Schlein, [place] [phone] 08/22/2015, 1:28 PM

## 2015-08-22 NOTE — Progress Notes (Signed)
Called PA [facility] - Brandon Morales and informed her that MRI called and stated the patient does not fit in our machine.

## 2015-08-23 ENCOUNTER — Other Ambulatory Visit (HOSPITAL_COMMUNITY): Payer: BC Managed Care – PPO | Admitting: Psychiatry

## 2015-08-23 ENCOUNTER — Encounter (HOSPITAL_COMMUNITY): Payer: Self-pay | Admitting: General Practice

## 2015-08-23 DIAGNOSIS — F411 Generalized anxiety disorder: Secondary | ICD-10-CM | POA: Diagnosis not present

## 2015-08-23 DIAGNOSIS — G4733 Obstructive sleep apnea (adult) (pediatric): Secondary | ICD-10-CM | POA: Diagnosis not present

## 2015-08-23 DIAGNOSIS — M545 Low back pain: Secondary | ICD-10-CM | POA: Diagnosis not present

## 2015-08-23 LAB — GLUCOSE, CAPILLARY
GLUCOSE-CAPILLARY: 189 mg/dL — AB (ref 65–99)
Glucose-Capillary: 122 mg/dL — ABNORMAL HIGH (ref 65–99)

## 2015-08-23 NOTE — Progress Notes (Signed)
Patient ID: Brandon Morales, male   DOB: 05/26/1985, 30 y.o.   MRN: [id number]  Discharge note  Date of admission to IOP was 07/26/2015 Date of discharge was 08/23/2015  Brandon Morales attended group for a bit longer than a week and then had back problems that caused him not to be able to return.  While he was her he was quiet in groups but said that listening and feeling welcomed was helpful to him.  The structure was also helpful to get him out of the house and focus on something other than his depressed mood.  He wanted to return but was unable to.  Final diagnosis remains generalized anxiety disorder and persistent mood disorder  Mental status at the last contact revealed no suicidal thoughts and phone calls to him indicated he was managing though still depressed  Plan:  He has appointments with a provider and therapist

## 2015-08-23 NOTE — Progress Notes (Signed)
Orthopedic Tech Progress Note Patient Details:  Brandon JacksBenjamin Sorter 11/30/1985 [id number]  Ortho Devices Type of Ortho Device: Knee Immobilizer Ortho Device/Splint Interventions: Application   Saul FordyceJennifer C Halden Phegley 08/23/2015, 1:33 PM

## 2015-08-23 NOTE — Discharge Summary (Signed)
Physician Discharge Summary  Brandon JacksBenjamin Morales ZOX:[id number] DOB: 02/11/1985 DOA: 08/21/2015  PCP: Paulino RilyJONES,ENRICO G, MD  Admit date: 08/21/2015 Discharge date: 08/23/2015  Recommendations for Outpatient Follow-up:  1. Pt decline SNF placement and prefers to go home. No changes in medications on discharge.   Discharge Diagnoses:  Principal Problem:   Intractable low back pain Active Problems:   BMI 50.0-59.9, adult (HCC)   Anxiety state   TIA (transient ischemic attack)   Diabetes mellitus without complication (HCC)   Lumbar radiculopathy   OSA (obstructive sleep apnea)    Discharge Condition: stable   Diet recommendation: as tolerated   History of present illness:  30 y.o.malewith past medical history significant for morbid obesity, obstructive sleep apnea, anxiety, recent workup for CEA who [facility] with intractable low back pain. Apparently patient fell 4 days ago at home and not able to get up from the floor. Patient to Tylenol No. 3 with partial symptomatic relief. He was scheduled for MRI as an outpatient as he could not lay down flat due to intractable pain. Patient also reported associated numbness in left knee. Denies numbness or tingling in other areas and no sensation loss. Patient admitted for pain management.  Hospital Course:    Assessment & Plan:   Principal Problem:   Intractable low back pain / Morbid obesity due to excess calories - Unclear etiology. Patient is morbidly obese and weight of 452 pounds, unable to fit into CT or MRI scan - X ray showed degenerative changes - Pt wants to go home today - HH order placed on discharge as pt declined SNF placement which was recommended by PT  Active Problems:   Diabetes mellitus without complication without long term insulin use (HCC) - Continue SSI in hospital - Metformin on discharge as per prior to this admission     Anxiety and depression - Continue Xanax and Paxil     Obstructive sleep apnea - Stable    DVT prophylaxis: Lovenox subcutaneous Code Status: full code  Family Communication: No family at the bedside    Consultants:   PT   Procedures:   None   Antimicrobials:   None     Signed:  Manson PasseyEVINE, Calleen Alvis, MD  [facility] 08/23/2015, 12:40 PM  Pager #: [phone]  Time spent in minutes: less than 30 minutes    Discharge Exam: Vitals:   08/23/15 0541 08/23/15 1124  BP: 133/69 109/66  Pulse: 85 68  Resp: 16 16  Temp: 98.3 F (36.8 C)    Vitals:   08/22/15 1943 08/23/15 0541 08/23/15 1100 08/23/15 1124  BP: 113/68 133/69  109/66  Pulse: 81 85  68  Resp: 16 16  16   Temp: 98.2 F (36.8 C) 98.3 F (36.8 C)    TempSrc: Oral Oral Oral   SpO2: 98% 96% 95% 95%  Weight:      Height:        General: Pt is alert, follows commands appropriately, not in acute distress, morbidly obese  Cardiovascular: Regular rate and rhythm, S1/S2 +, no murmurs Respiratory: Clear to auscultation bilaterally, no wheezing, no crackles, no rhonchi Abdominal: Soft, obese, non distended, bowel sounds +, no guarding Extremities: no cyanosis, pulses palpable bilaterally DP and PT Neuro: Grossly nonfocal  Discharge Instructions  Discharge Instructions    Call MD for:  persistant nausea and vomiting    Complete by:  As directed   Call MD for:  redness, tenderness, or signs of infection (pain, swelling, redness, odor  or green/yellow discharge around incision site)    Complete by:  As directed   Call MD for:  severe uncontrolled pain    Complete by:  As directed   Diet - low sodium heart healthy    Complete by:  As directed   Increase activity slowly    Complete by:  As directed       Medication List    TAKE these medications   acetaminophen-codeine 300-30 MG tablet Commonly known as:  TYLENOL #3 Take 1 tablet by mouth every 8 (eight) hours as needed.   ADVIL PM 200-25 MG Caps Generic drug:  Ibuprofen-Diphenhydramine HCl Take 1  capsule by mouth at bedtime as needed (sleep).   albuterol (2.5 MG/3ML) 0.083% nebulizer solution Commonly known as:  PROVENTIL Take 3 mLs (2.5 mg total) by nebulization every 6 (six) hours as needed. What changed:  reasons to take this   albuterol 108 (90 Base) MCG/ACT inhaler Commonly known as:  PROVENTIL HFA;VENTOLIN HFA Inhale 2 puffs into the lungs every 6 (six) hours as needed. What changed:  reasons to take this   ALPRAZolam 0.5 MG tablet Commonly known as:  XANAX Take 0.5 mg by mouth at bedtime.   cyclobenzaprine 5 MG tablet Commonly known as:  FLEXERIL Take 5 mg by mouth daily as needed for muscle spasms.   divalproex 500 MG 24 hr tablet Commonly known as:  DEPAKOTE ER Take 500 mg by mouth daily.   DULERA 100-5 MCG/ACT Aero Generic drug:  mometasone-formoterol Inhale 2 puffs into the lungs daily as needed for wheezing.   ibuprofen 800 MG tablet Commonly known as:  ADVIL,MOTRIN Take 800 mg by mouth every 8 (eight) hours as needed for moderate pain.   meloxicam 15 MG tablet Commonly known as:  MOBIC Take 15 mg by mouth daily as needed for pain.   metFORMIN 500 MG tablet Commonly known as:  GLUCOPHAGE Take 1 tablet (500 mg total) by mouth 2 (two) times daily with a meal.   PARoxetine 20 MG tablet Commonly known as:  PAXIL Take 20 mg by mouth daily.      Follow-up Information    Paulino Rily, MD. Schedule an appointment as soon as possible for a visit in 1 week(s).   Specialty:  Family Medicine Contact information: [address] [place] [address] [phone]            The results of significant diagnostics from this hospitalization (including imaging, microbiology, ancillary and laboratory) are listed below for reference.    Significant Diagnostic Studies: Dg Lumbar Spine Complete  Result Date: 08/22/2015 CLINICAL DATA:  Recent fall following syncopal episode with low back pain, initial encounter EXAM: LUMBAR SPINE - COMPLETE 4+ VIEW  COMPARISON:  None. FINDINGS: Osteophytic changes are noted at multiple levels. No compression deformity is seen. Mild disc space narrowing is noted throughout the lumbar spine. No anterolisthesis is noted. No soft tissue abnormality is seen. IMPRESSION: Degenerative change without acute abnormality. Electronically Signed   By: Alcide Clever M.D.   On: 08/22/2015 12:52   Mr Brain Wo Contrast  Result Date: 08/04/2015  [facility] [address], [address] [place], [place] [address] [phone] NEUROIMAGING REPORT STUDY DATE: 07/29/2015 PATIENT NAME: Taaj Hurlbut DOB: 02/29/1985 MRN: [id number] EXAM: MRI Brain without contrast ORDERING CLINICIAN: York Spaniel M.D. 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  T1 sagittal, T2 coronal  were also obtained. CONTRAST: none IMAGING SITE: [facility], [address] [place], [place] [address] FINDINGS: On sagittal images, the spinal cord is imaged caudally to C3 and is normal in caliber.   The contents of the posterior fossa are of normal size and position.   The pituitary gland and optic chiasm appear normal.    Brain volume appears normal.   The ventricles are normal in size and without distortion.  There are no abnormal extra-axial collections of fluid.  The cerebellum and brainstem appears normal.   The deep gray matter appears normal.  The cerebral hemispheres appear normal.  Diffusion weighted images are normal.  Gradient echo heme weighted images are normal. The orbits appear normal.   The VIIth/VIIIth nerve complex appears normal.  The mastoid air cells appear normal.  The paranasal sinuses appear normal.  Flow voids are identified within the major intracerebral arteries.        This is a normal MRI of the  brain without contrast. INTERPRETING PHYSICIAN: Richard A. Epimenio Foot, MD, PhD Certified in  Neuroimaging by American Society of Neuroimaging    Microbiology: No results found for this or any previous visit (from the past 240 hour(s)).   Labs: Basic Metabolic Panel:  Recent Labs Lab 08/21/15 1232  NA 141  K 3.6  CL 103  GLUCOSE 128*  BUN 14  CREATININE 0.80   Liver Function Tests: No results for input(s): AST, ALT, ALKPHOS, BILITOT, PROT, ALBUMIN in the last 168 hours. No results for input(s): LIPASE, AMYLASE in the last 168 hours. No results for input(s): AMMONIA in the last 168 hours. CBC:  Recent Labs Lab 08/21/15 1221 08/21/15 1232  WBC 5.4  --   NEUTROABS 3.3  --   HGB 13.1 12.9*  HCT 39.1 38.0*  MCV 82.1  --   PLT 184  --    Cardiac Enzymes: No results for input(s): CKTOTAL, CKMB, CKMBINDEX, TROPONINI in the last 168 hours. BNP: BNP (last 3 results) No results for input(s): BNP in the last 8760 hours.  ProBNP (last 3 results) No results for input(s): PROBNP in the last 8760 hours.  CBG:  Recent Labs Lab 08/22/15 1142 08/22/15 1621 08/22/15 2223 08/23/15 0819 08/23/15 1141  GLUCAP 164* 162* 122* 189* 122*

## 2015-08-23 NOTE — Progress Notes (Signed)
Patient's mother just called and stated out of Tylenol #3. Dr. Elisabeth Pigeonevine did not write a prescription because it was a home med.  Patient and mother did not tell us Hedwig Morton(Heather Tetreault RN) during discharge teaching that they did not have anymore at home.  We did inform them that his medications were not changing and to continue to take his Tylenol #3 when he returned home.  Contacted Dr. Elisabeth Pigeonevine stated he needs to contact his primary care provider.  Herbert SetaHeather called his mother back and informed her of this.

## 2015-08-23 NOTE — Care Management Note (Signed)
Case Management Note  Patient Details  Name: Brandon Morales MRN: [id number] Date of Birth: 11/08/1985  Subjective/Objective:    30 yr old male admitted with  Back pain.               Action/Plan: Referral was called  To [facility]iaison, Brandon Morales. Patient lives with his mom who will assist him at home.  Expected Discharge Date:    08/23/15              Expected Discharge Plan:  Home w Home Health Services  In-House Referral:  Clinical Social Work  Discharge planning Services  CM Consult  Post Acute Care Choice:  Home Health Choice offered to:  Patient, Parent  DME Arranged:  N/A DME Agency:     HH Arranged:  PT, RN, OT HH Agency:     Status of Service:  Completed, signed off  If discussed at Long Length of Stay Meetings, dates discussed:    Additional Comments:  Brandon Morales, Brandon Buck Naomi, RN 08/23/2015, 2:44 PM

## 2015-08-23 NOTE — Progress Notes (Signed)
Provided discharge instructions and answered all questions 

## 2015-08-23 NOTE — Progress Notes (Signed)
Met with patient and his mother re: recent A1C and need to take care of his diabetes.  I reviewed the importance of the A1C and the benefits of lowering the A1C by even one point- literature left for the patient.    I have encouraged the patient to pull out his meter at home and to start testing again. I have asked him to bring those blood sugars to her MD to evaluate if he will need more medication. Patient and his mother receptive to education.   Gentry Fitz, RN, BA, MHA, CDE Diabetes Coordinator Inpatient Diabetes Program  [phone] (Team Pager) [phone] ([place]) 08/23/2015 2:51 PM

## 2015-08-23 NOTE — Progress Notes (Signed)
Physical Therapy Treatment Patient Details Name: Brandon JacksBenjamin Vanessen MRN: [id number] DOB: 02/07/1985 Today's Date: 08/23/2015    History of Present Illness pt presents with back pain.  pt with TIA, Anxiety, and OSA.      PT Comments    Pt limited by pain and fear of falling. Pt unable to stand during this session despite multiple attempts. PT attempted to problem solve mobility as pt wishes to go home, when PT asked pt how they could work together to improve his mobility pt becomes labile and agitated, not wanting to think about his situation. PT gave pt UE and LE exercises for bed level strengthening.  Follow Up Recommendations  SNF     Equipment Recommendations  Rolling walker with 5" wheels;3in1 (PT);Wheelchair (measurements PT);Wheelchair cushion (measurements PT)    Recommendations for Other Services       Precautions / Restrictions Precautions Precautions: Fall;Back Restrictions Weight Bearing Restrictions: No    Mobility  Bed Mobility Overal bed mobility: Modified Independent   Rolling: Modified independent (Device/Increase time) Sidelying to sit: Modified independent (Device/Increase time)     Sit to sidelying: Modified independent (Device/Increase time) General bed mobility comments: pt able to perform log roll with use of bed rails to sit on EOB  Transfers                 General transfer comment: attempted x 3 but pt refuses due to c/o pain and weakness in bilat LEs  Ambulation/Gait                 Stairs            Wheelchair Mobility    Modified Rankin (Stroke Patients Only)       Balance                                    Cognition Arousal/Alertness: Awake/alert Behavior During Therapy: Anxious Overall Cognitive Status: Within Functional Limits for tasks assessed                      Exercises General Exercises - Upper Extremity Shoulder Flexion: 20 reps;Both;Theraband Theraband Level (Shoulder  Flexion): Level 3 (Green) Shoulder Horizontal ABduction: Both;20 reps;Theraband Theraband Level (Shoulder Horizontal Abduction): Level 3 (Green) General Exercises - Lower Extremity Ankle Circles/Pumps: Both;20 reps Heel Slides: Both;20 reps Hip ABduction/ADduction: Both;20 reps Other Exercises Other Exercises: D1 extension bilat UE with green theraband    General Comments        Pertinent Vitals/Pain Pain Score: 9  Pain Location: back and LEs Pain Descriptors / Indicators: Aching;Guarding;Grimacing Pain Intervention(s): Limited activity within patient's tolerance;Monitored during session;Premedicated before session;Repositioned    Home Living                      Prior Function            PT Goals (current goals can now be found in the care plan section) Acute Rehab PT Goals Patient Stated Goal: To Volunteer once he gets on disability. PT Goal Formulation: With patient Time For Goal Achievement: 09/05/15 Potential to Achieve Goals: Good Progress towards PT goals: Progressing toward goals    Frequency  Min 3X/week    PT Plan      Co-evaluation             End of Session   Activity Tolerance: Patient limited by pain Patient left: in bed;with  family/visitor present;with call bell/phone within reach     Time: 1030-1100 PT Time Calculation (min) (ACUTE ONLY): 30 min  Charges:  $Therapeutic Exercise: 8-22 mins $Therapeutic Activity: 8-22 mins                    G Codes:  Functional Assessment Tool Used: Clinical Judgement Functional Limitation: Mobility: Walking and moving around Mobility: Walking and Moving Around Current Status (Z6109): At least 20 percent but less than 40 percent impaired, limited or restricted Mobility: Walking and Moving Around Goal Status [phone]): 0 percent impaired, limited or restricted   Tabithia Stroder 08/23/2015, 11:05 AM

## 2015-08-23 NOTE — Progress Notes (Signed)
Inpatient Diabetes Program Recommendations  AACE/ADA: New Consensus Statement on Inpatient Glycemic Control (2015)  Target Ranges:  Prepandial:   less than 140 mg/dL      Peak postprandial:   less than 180 mg/dL (1-2 hours)      Critically ill patients:  140 - 180 mg/dL   Lab Results  Component Value Date   GLUCAP 189 (H) 08/23/2015   HGBA1C 8.5 (H) 08/21/2015    Review of Glycemic Control  Results for Brandon JacksWOOLFENDEN, Donato (MRN [id number]) as of 08/23/2015 11:26  Ref. Range 08/22/2015 06:48 08/22/2015 11:42 08/22/2015 16:21 08/22/2015 22:23 08/23/2015 08:19  Glucose-Capillary Latest Ref Range: 65 - 99 mg/dL 409118 (H) 811164 (H) 914162 (H) 122 (H) 189 (H)    Diabetes history: A1C 8.5% Outpatient Diabetes medications: Metformin 500mg  bid Current orders for Inpatient glycemic control: Novolog sensitive correction scale 0-9 units tid, Novolog 0-5 units qhs  Inpatient Diabetes Program Recommendations:  Agree with current orders for blood sugar management.  Susette RacerJulie Lynzie Cliburn, RN, BA, MHA, CDE Diabetes Coordinator Inpatient Diabetes Program  [phone] (Team Pager) [phone] [facility] Office) 08/23/2015 11:29 AM

## 2015-08-23 NOTE — Plan of Care (Signed)
Problem: Safety: Goal: Ability to remain free from injury will improve Outcome: Progressing Safety precautions maintained, no fall or injury noted  Problem: Pain Managment: Goal: General experience of comfort will improve Outcome: Progressing Medicated oncce for pain this shift with full relief  Problem: Skin Integrity: Goal: Risk for impaired skin integrity will decrease Outcome: Progressing No skin issues noted  Problem: Tissue Perfusion: Goal: Risk factors for ineffective tissue perfusion will decrease Outcome: Progressing No S/S of DVT noted, SCDs on  Problem: Fluid Volume: Goal: Ability to maintain a balanced intake and output will improve Outcome: Progressing Taking PO fluids without difficulty  Problem: Nutrition: Goal: Adequate nutrition will be maintained Outcome: Progressing No nutrition issues noted  Problem: Bowel/Gastric: Goal: Will not experience complications related to bowel motility Outcome: Progressing No bowel issues noted

## 2015-08-23 NOTE — Progress Notes (Signed)
   08/23/15 1124  What Happened  Was fall witnessed? Yes  Who witnessed fall? Clydie BraunKaren PT  Patients activity before fall during therapy  Point of contact buttocks  Was patient injured? Unsure  Follow Up  MD notified Dr. Elisabeth Pigeonevine   Time MD notified 1110  Family notified Yes-comment (Family witnessed)  Additional tests No  Simple treatment Ice  Progress note created (see row info) Yes  Adult Fall Risk Assessment  Risk Factor Category (scoring not indicated) High fall risk per protocol (document High fall risk)  Age 30  Fall History: Fall within 6 months prior to admission 5  Elimination; Bowel and/or Urine Incontinence 0  Elimination; Bowel and/or Urine Urgency/Frequency 0  Medications: includes PCA/Opiates, Anti-convulsants, Anti-hypertensives, Diuretics, Hypnotics, Laxatives, Sedatives, and Psychotropics 5  Patient Care Equipment 2  Mobility-Assistance 2  Mobility-Gait 2  Mobility-Sensory Deficit 0  Altered awareness of immediate physical environment 0  Impulsiveness 0  Lack of understanding of one's physical/cognitive limitations 0  Total Score 16  Patient's Fall Risk High Fall Risk (>13 points)  Adult Fall Risk Interventions  Required Bundle Interventions *See Row Information* High fall risk - low, moderate, and high requirements implemented  Additional Interventions Use of appropriate toileting equipment (bedpan, BSC, etc.)  Screening for Fall Injury Risk  Risk For Fall Injury- See Row Information  Nurse judgement  Vitals  Temp (at time of fall)  BP 109/66  BP Location Left Arm  BP Method Automatic  Patient Position (if appropriate) Lying  Pulse Rate 68  Resp 16  Oxygen Therapy  SpO2 95 %  O2 Device Room Air  Pain Assessment  Pain Assessment 0-10  Pain Score 10  Pain Type Chronic pain  Pain Location Knee  Pain Orientation Left  Pain Onset On-going  Patients Stated Pain Goal 0  Pain Intervention(s) Medication (See eMAR);Cold applied  PCA/Epidural/Spinal Assessment   Respiratory Pattern Regular;Unlabored;Symmetrical  Neurological  Neuro (WDL) WDL  Level of Consciousness Alert  Orientation Level Oriented X4  Cognition Appropriate at baseline  Speech Clear  Musculoskeletal  Musculoskeletal (WDL) X  Generalized Weakness Yes  Weight Bearing Restrictions No  Musculoskeletal Details  RLE Limited movement  LLE Limited movement  Integumentary  Integumentary (WDL) WDL    Pt notified us that patient fell during therapy. Doctor was notified. Patients vital signs stable and there is no visible injury. Pain medication has been provided. Ice has been applied to left knee, this knee was not injured in fall, patient had complained of pain here prior to fall and doctor had been made aware. Will continue to monitor.

## 2015-08-23 NOTE — Care Management Note (Signed)
Case Management Note  Patient Details  Name: Clent JacksBenjamin Nevel MRN: [id number] Date of Birth: 02/08/1985  Subjective/Objective:                    Action/Plan: Case manager spoke with patient and mother concerning discharge plan. Patient has not worked with therapy due to pain and he states he isnt able to weight bear. PT has recommended SNF. Patient and mother want to pursue and SNF bed. CM has notified Child psychotherapistocial worker.   Expected Discharge Date:   08/24/15               Expected Discharge Plan:   Skilled Nursing Facility  In-House Referral:  Clinical Social Work  Discharge planning Services  CM Consult  Post Acute Care Choice:    Choice offered to:     DME Arranged:    DME Agency:     HH Arranged:  NA HH Agency:     Status of Service:  In process, will continue to follow  If discussed at Long Length of Stay Meetings, dates discussed:    Additional Comments:  Durenda GuthrieBrady, Khaliya Golinski Naomi, RN 08/23/2015, 12:08 PM

## 2015-08-23 NOTE — Discharge Instructions (Signed)
Back Injury Prevention Back injuries can be very painful. They can also be difficult to heal. After having one back injury, you are more likely to injure your back again. It is important to learn how to avoid injuring or re-injuring your back. The following tips can help you to prevent a back injury. WHAT SHOULD I KNOW ABOUT PHYSICAL FITNESS?  Exercise for 30 minutes per day on most days of the week or as directed by your health care provider. Make sure to:  Do aerobic exercises, such as walking, jogging, biking, or swimming.  Do exercises that increase balance and strength, such as tai chi and yoga. These can decrease your risk of falling and injuring your back.  Do stretching exercises to help with flexibility.  Try to develop strong abdominal muscles. Your abdominal muscles provide a lot of the support that is needed by your back.  Maintain a healthy weight. This helps to decrease your risk of a back injury. WHAT SHOULD I KNOW ABOUT MY DIET?  Talk with your health care provider about your overall diet. Take supplements and vitamins only as directed by your health care provider.  Talk with your health care provider about how much calcium and vitamin D you need each day. These nutrients help to prevent weakening of the bones (osteoporosis). Osteoporosis can cause broken (fractured) bones, which lead to back pain.  Include good sources of calcium in your diet, such as dairy products, green leafy vegetables, and products that have had calcium added to them (fortified).  Include good sources of vitamin D in your diet, such as milk and foods that are fortified with vitamin D. WHAT SHOULD I KNOW ABOUT MY POSTURE?  Sit up straight and stand up straight. Avoid leaning forward when you sit or hunching over when you stand.  Choose chairs that have good low-back (lumbar) support.  If you work at a desk, sit close to it so you do not need to lean over. Keep your chin tucked in. Keep your neck  drawn back, and keep your elbows bent at a right angle. Your arms should look like the letter "L."  Sit high and close to the steering wheel when you drive. Add a lumbar support to your car seat, if needed.  Avoid sitting or standing in one position for very long. Take breaks to get up, stretch, and walk around at least one time every hour. Take breaks every hour if you are driving for long periods of time.  Sleep on your side with your knees slightly bent, or sleep on your back with a pillow under your knees. Do not lie on the front of your body to sleep. WHAT SHOULD I KNOW ABOUT LIFTING, TWISTING, AND REACHING? Lifting and Heavy Lifting  Avoid heavy lifting, especially repetitive heavy lifting. If you must do heavy lifting:  Stretch before lifting.  Work slowly.  Rest between lifts.  Use a tool such as a cart or a dolly to move objects if one is available.  Make several small trips instead of carrying one heavy load.  Ask for help when you need it, especially when moving big objects.  Follow these steps when lifting:  Stand with your feet shoulder-width apart.  Get as close to the object as you can. Do not try to pick up a heavy object that is far from your body.  Use handles or lifting straps if they are available.  Bend at your knees. Squat down, but keep your heels off the floor.  heavy object that is far from your body.   Use handles or lifting straps if they are available.   Bend at your knees. Squat down, but keep your heels off the floor.   Keep your shoulders pulled back, your chin tucked in, and your back straight.   Lift the object slowly while you tighten the muscles in your legs, abdomen, and buttocks. Keep the object as close to the center of your body as possible.   Follow these steps when putting down a heavy load:   Stand with your feet shoulder-width apart.   Lower the object slowly while you tighten the muscles in your legs, abdomen, and buttocks. Keep the object as close to the center of your body as possible.   Keep your shoulders pulled back, your chin tucked in, and your back straight.   Bend at your knees. Squat  down, but keep your heels off the floor.   Use handles or lifting straps if they are available.  Twisting and Reaching   Avoid lifting heavy objects above your waist.   Do not twist at your waist while you are lifting or carrying a load. If you need to turn, move your feet.   Do not bend over without bending at your knees.   Avoid reaching over your head, across a table, or for an object on a high surface.  WHAT ARE SOME OTHER TIPS?   Avoid wet floors and icy ground. Keep sidewalks clear of ice to prevent falls.   Do not sleep on a mattress that is too soft or too hard.   Keep items that are used frequently within easy reach.   Put heavier objects on shelves at waist level, and put lighter objects on lower or higher shelves.   Find ways to decrease your stress, such as exercise, massage, or relaxation techniques. Stress can build up in your muscles. Tense muscles are more vulnerable to injury.   Talk with your health care provider if you feel anxious or depressed. These conditions can make back pain worse.   Wear flat heel shoes with cushioned soles.   Avoid sudden movements.   Use both shoulder straps when carrying a backpack.   Do not use any tobacco products, including cigarettes, chewing tobacco, or electronic cigarettes. If you need help quitting, ask your health care provider.    This information is not intended to replace advice given to you by your health care provider. Make sure you discuss any questions you have with your health care provider.    Document Released: 02/09/2004 Document Revised: 05/18/2014 Document Reviewed: 01/05/2014  Elsevier Interactive Patient Education 2016 [employer]        Back Pain, Adult  Back pain is very common in adults.The cause of back pain is rarely dangerous and the pain often gets better over time.The cause of your back pain may not be known. Some common causes of back pain include:   Strain of the muscles or ligaments supporting the spine.   Wear  and tear (degeneration) of the spinal disks.   Arthritis.        Direct injury to the back.  For many people, back pain may return. Since back pain is rarely dangerous, most people can learn to manage this condition on their own.  HOME CARE INSTRUCTIONS  Watch your back pain for any changes. The following actions may help to lessen any discomfort you are feeling:   Remain active. It is stressful on your back to   not stay in bed.Resting more than 1-2 days can delay your recovery.  Pay attention to your body when you bend and lift. The most comfortable positions are those that put less stress on your recovering back. Always use proper lifting techniques, including:  Bending your knees.  Keeping the load close to your body.  Avoiding twisting.  Find a comfortable position to sleep. Use a firm mattress and lie on your side with your knees slightly bent. If you lie on your back, put a pillow under your knees.  Avoid feeling anxious or stressed.Stress increases muscle tension and can worsen back pain.It is important to recognize when you are anxious or stressed and learn ways to manage it, such as with exercise.  Take medicines only as directed by your health care provider. Over-the-counter medicines to reduce pain and inflammation are often the most helpful.Your health care provider may prescribe muscle relaxant drugs.These medicines help dull your pain so you can more quickly return to your normal activities and healthy exercise.  Apply ice to the injured area:  Put ice in a plastic  bag.  Place a towel between your skin and the bag.  Leave the ice on for 20 minutes, 2-3 times a day for the first 2-3 days. After that, ice and heat may be alternated to reduce pain and spasms.  Maintain a healthy weight. Excess weight puts extra stress on your back and makes it difficult to maintain good posture. SEEK MEDICAL CARE IF:  You have pain that is not relieved with rest or medicine.  You have increasing pain going down into the legs or buttocks.  You have pain that does not improve in one week.  You have night pain.  You lose weight.  You have a fever or chills. SEEK IMMEDIATE MEDICAL CARE IF:   You develop new bowel or bladder control problems.  You have unusual weakness or numbness in your arms or legs.  You develop nausea or vomiting.  You develop abdominal pain.  You feel faint.   This information is not intended to replace advice given to you by your health care provider. Make sure you discuss any questions you have with your health care provider.   Document Released: 01/01/2005 Document Revised: 01/22/2014 Document Reviewed: 05/05/2013 Elsevier Interactive Patient Education 2016 [place].   Back Exercises If you have pain in your back, do these exercises 2-3 times each day or as told by your doctor. When the pain goes away, do the exercises once each day, but repeat the steps more times for each exercise (do more repetitions). If you do not have pain in your back, do these exercises once each day or as told by your doctor. EXERCISES Single Knee to Chest Do these steps 3-5 times in a row for each leg: 1. Lie on your back on a firm bed or the floor with your legs stretched out. 2. Bring one knee to your chest. 3. Hold your knee to your chest by grabbing your knee or thigh. 4. Pull on your knee until you feel a gentle stretch in your lower back. 5. Keep doing the stretch for 10-30 seconds. 6. Slowly let go of your leg and straighten it. Pelvic  Tilt Do these steps 5-10 times in a row: 1. Lie on your back on a firm bed or the floor with your legs stretched out. 2. Bend your knees so they point up to the ceiling. Your feet should be flat on the floor. 3. Tighten your  lower belly (abdomen) muscles to press your lower back against the floor. This will make your tailbone point up to the ceiling instead of pointing down to your feet or the floor. 4. Stay in this position for 5-10 seconds while you gently tighten your muscles and breathe evenly. Cat-Cow Do these steps until your lower back bends more easily: 1. Get on your hands and knees on a firm surface. Keep your hands under your shoulders, and keep your knees under your hips. You may put padding under your knees. 2. Let your head hang down, and make your tailbone point down to the floor so your lower back is round like the back of a cat. 3. Stay in this position for 5 seconds. 4. Slowly lift your head and make your tailbone point up to the ceiling so your back hangs low (sags) like the back of a cow. 5. Stay in this position for 5 seconds. Press-Ups Do these steps 5-10 times in a row: 1. Lie on your belly (face-down) on the floor. 2. Place your hands near your head, about shoulder-width apart. 3. While you keep your back relaxed and keep your hips on the floor, slowly straighten your arms to raise the top half of your body and lift your shoulders. Do not use your back muscles. To make yourself more comfortable, you may change where you place your hands. 4. Stay in this position for 5 seconds. 5. Slowly return to lying flat on the floor. Bridges Do these steps 10 times in a row: 1. Lie on your back on a firm surface. 2. Bend your knees so they point up to the ceiling. Your feet should be flat on the floor. 3. Tighten your butt muscles and lift your butt off of the floor until your waist is almost as high as your knees. If you do not feel the muscles working in your butt and the back of  your thighs, slide your feet 1-2 inches farther away from your butt. 4. Stay in this position for 3-5 seconds. 5. Slowly lower your butt to the floor, and let your butt muscles relax. If this exercise is too easy, try doing it with your arms crossed over your chest. Belly Crunches Do these steps 5-10 times in a row: 1. Lie on your back on a firm bed or the floor with your legs stretched out. 2. Bend your knees so they point up to the ceiling. Your feet should be flat on the floor. 3. Cross your arms over your chest. 4. Tip your chin a little bit toward your chest but do not bend your neck. 5. Tighten your belly muscles and slowly raise your chest just enough to lift your shoulder blades a tiny bit off of the floor. 6. Slowly lower your chest and your head to the floor. Back Lifts Do these steps 5-10 times in a row: 1. Lie on your belly (face-down) with your arms at your sides, and rest your forehead on the floor. 2. Tighten the muscles in your legs and your butt. 3. Slowly lift your chest off of the floor while you keep your hips on the floor. Keep the back of your head in line with the curve in your back. Look at the floor while you do this. 4. Stay in this position for 3-5 seconds. 5. Slowly lower your chest and your face to the floor. GET HELP IF:  Your back pain gets a lot worse when you do an exercise.  Your back  pain does not lessen 2 hours after you exercise. If you have any of these problems, stop doing the exercises. Do not do them again unless your doctor says it is okay. GET HELP RIGHT AWAY IF:  You have sudden, very bad back pain. If this happens, stop doing the exercises. Do not do them again unless your doctor says it is okay.   This information is not intended to replace advice given to you by your health care provider. Make sure you discuss any questions you have with your health care provider.   Document Released: 02/03/2010 Document Revised: 09/22/2014 Document  Reviewed: 02/25/2014 Elsevier Interactive Patient Education [employer].

## 2015-08-23 NOTE — Care Management (Signed)
Patient will discharge to home per MD. Mom states he will need to be transported home by ambulance, she is unable to get him into her SUV or into the house.

## 2015-08-23 NOTE — Progress Notes (Signed)
Physical Therapy Treatment Patient Details Name: Brandon Morales MRN: [id number] DOB: [birth year] Today's Date: 08/23/2015    History of Present Illness pt presents with back pain.  pt with TIA, Anxiety, and OSA.      PT Comments    Pt seen for second session per pt and case management request. Pt impulsive and attempted to get out of bed while PT was explaining treatment plan. Pt thrust himself forward and onto the floor. See below for details. RN, MD and case manager notified. PT recommends lift equipment for pt and staff safety with any out of bed activity.  Follow Up Recommendations  SNF     Equipment Recommendations  Rolling walker with 5" wheels;3in1 (PT);Wheelchair (measurements PT);Wheelchair cushion (measurements PT)    Recommendations for Other Services       Precautions / Restrictions Precautions Precautions: Fall;Back Restrictions Weight Bearing Restrictions: No    Mobility  Bed Mobility Overal bed mobility: Modified Independent   Rolling: Modified independent (Device/Increase time) Sidelying to sit: Modified independent (Device/Increase time)     Sit to sidelying: Modified independent (Device/Increase time) General bed mobility comments: pt able to perform log roll with use of bed rails to sit on EOB  Transfers                 General transfer comment: pt sitting edge of bed. as PT was explaining treatment plan pt used momentum to thrust himself out of bed and onto the floor. Pt fell on his bottom and then laid back. RN notified and lift equipment used to get patient off of floor. While pt was sitting on the floor awaitng lift equipment he was able to do a full sit up multiple times and able to sit with his legs crossed "[place] style". Pt continues to c/o increased pain and LE weakness causing him to be unable to perform mobility tasks.   Ambulation/Gait                 Stairs            Wheelchair Mobility    Modified Rankin (Stroke  Patients Only)       Balance                                    Cognition Arousal/Alertness: Awake/alert Behavior During Therapy: Anxious;Impulsive Overall Cognitive Status: Within Functional Limits for tasks assessed                      Exercises General Exercises - Upper Extremity Shoulder Flexion: 20 reps;Both;Theraband Theraband Level (Shoulder Flexion): Level 3 (Green) Shoulder Horizontal ABduction: Both;20 reps;Theraband Theraband Level (Shoulder Horizontal Abduction): Level 3 (Green) General Exercises - Lower Extremity Ankle Circles/Pumps: Both;20 reps Heel Slides: Both;20 reps Hip ABduction/ADduction: Both;20 reps Other Exercises Other Exercises: D1 extension bilat UE with green theraband    General Comments        Pertinent Vitals/Pain Pain Score: 9  Pain Location: back and LEs Pain Descriptors / Indicators: Aching;Guarding;Grimacing Pain Intervention(s): Limited activity within patient's tolerance;Monitored during session;Repositioned    Home Living                      Prior Function            PT Goals (current goals can now be found in the care plan section) Acute Rehab PT Goals Patient Stated Goal: To Volunteer once  he gets on disability. PT Goal Formulation: With patient Time For Goal Achievement: 09/05/15 Potential to Achieve Goals: Good Progress towards PT goals: Progressing toward goals    Frequency  Min 3X/week    PT Plan Current plan remains appropriate    Co-evaluation             End of Session   Activity Tolerance: Patient limited by pain Patient left: in bed;with call bell/phone within reach;with bed alarm set;with family/visitor present;with nursing/sitter in room     Time: 1120-1138 PT Time Calculation (min) (ACUTE ONLY): 18 min  Charges:  $Therapeutic Exercise: 8-22 mins $Therapeutic Activity: 8-22 mins                    G Codes:  Functional Assessment Tool Used: Clinical  Judgement Functional Limitation: Mobility: Walking and moving around Mobility: Walking and Moving Around Current Status ([id number](G8978): At least 20 percent but less than 40 percent impaired, limited or restricted Mobility: Walking and Moving Around Goal Status [phone](G8979): 0 percent impaired, limited or restricted   Marquasia Schmieder 08/23/2015, 1:35 PM

## 2015-08-24 ENCOUNTER — Other Ambulatory Visit (HOSPITAL_COMMUNITY): Payer: BC Managed Care – PPO

## 2015-08-24 NOTE — Progress Notes (Signed)
Brandon Morales is a 30 y.o., single, Caucasian male; who was referred per Melony Overlyeresa Hurst, PA-C; treatment for worsening anxiety and depressive symptoms.  Pt c/o of anxiety to the point of not being able to leave his home.  Denied SI/HI or A/V hallucinations.  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  According to pt, the job became more and more stressful three summers ago, whenever a new lieutentant was hired.  Pt reports that he and a few other guards would follow procedures and regulations, but the others wouldn't and he couldn't count on them to back him up in encounters with inmates.  "I feel they are trying to get me to quit my job, because I make more money." 2)  Medical Issues:  Asthma and Diabetes.  Reports a recent TIA.  Pt reports one prior psychiatric hospitalization when he was in high school due to anger issues with SI.  Denies any previous suicide attempts.  Has been seeing Melony Overlyeresa Hurst, PA-C, but is in need of a therapist.  States Rosey Batheresa recently started him on Depakote and he is tolerating it well.  Family Hx:  Mother (Depression).                                    Patient's last day attending [facility] was 08-08-15.  He has been struggling with lower back issues. Due to absences, pt will be discharged today until he is feeling better and able to attend on a regular basis.  Pt is welcome to return to [facility].  A:  D/C pt today.  F/U with Melony Overlyeresa Hurst, PA-C.  Encouraged support groups and the [facility] Tuesday 5-6 pm.  Strongly recommend pt to f/u with a therapist in Teresa's office.  R:  Pt receptive.   Jeri Modenaita Erikah Thumm, M.Ed, CNA

## 2015-08-24 NOTE — Patient Instructions (Signed)
Patient was discharged today due to missing days due to medical issues (chronic pain).  Encouraged pt to return to [facility] once he is able.  Patient will follow up with Melony Overlyeresa Hurst, PA-C the end of the month.

## 2015-08-25 ENCOUNTER — Other Ambulatory Visit (HOSPITAL_COMMUNITY): Payer: BC Managed Care – PPO

## 2015-08-26 ENCOUNTER — Other Ambulatory Visit (HOSPITAL_COMMUNITY): Payer: BC Managed Care – PPO

## 2015-08-29 ENCOUNTER — Other Ambulatory Visit (HOSPITAL_COMMUNITY): Payer: BC Managed Care – PPO

## 2015-08-30 ENCOUNTER — Other Ambulatory Visit (HOSPITAL_COMMUNITY): Payer: BC Managed Care – PPO

## 2015-08-31 ENCOUNTER — Other Ambulatory Visit (HOSPITAL_COMMUNITY): Payer: BC Managed Care – PPO

## 2015-09-01 ENCOUNTER — Other Ambulatory Visit (HOSPITAL_COMMUNITY): Payer: BC Managed Care – PPO

## 2015-09-02 ENCOUNTER — Other Ambulatory Visit (HOSPITAL_COMMUNITY): Payer: BC Managed Care – PPO

## 2015-09-02 ENCOUNTER — Other Ambulatory Visit (HOSPITAL_COMMUNITY): Payer: Self-pay | Admitting: Orthopedic Surgery

## 2015-09-05 ENCOUNTER — Other Ambulatory Visit (HOSPITAL_COMMUNITY): Payer: BC Managed Care – PPO

## 2015-09-05 ENCOUNTER — Other Ambulatory Visit (HOSPITAL_COMMUNITY): Payer: Self-pay | Admitting: Orthopedic Surgery

## 2015-09-05 DIAGNOSIS — M5416 Radiculopathy, lumbar region: Secondary | ICD-10-CM

## 2015-09-06 ENCOUNTER — Other Ambulatory Visit (HOSPITAL_COMMUNITY): Payer: BC Managed Care – PPO

## 2015-09-07 ENCOUNTER — Other Ambulatory Visit (HOSPITAL_COMMUNITY): Payer: BC Managed Care – PPO

## 2015-09-08 ENCOUNTER — Other Ambulatory Visit (HOSPITAL_COMMUNITY): Payer: BC Managed Care – PPO

## 2015-09-09 ENCOUNTER — Ambulatory Visit: Payer: BC Managed Care – PPO | Admitting: Cardiology

## 2015-09-09 ENCOUNTER — Telehealth: Payer: Self-pay | Admitting: Cardiology

## 2015-09-09 ENCOUNTER — Other Ambulatory Visit (HOSPITAL_COMMUNITY): Payer: BC Managed Care – PPO

## 2015-09-09 NOTE — Telephone Encounter (Signed)
Follow up       Returning a call to the nurse.  [facility] said he will be in and out of patient's home this afternoon.  It is ok to leave a message on his secured vm

## 2015-09-09 NOTE — Telephone Encounter (Signed)
Returned call.  [facility] Physical Therapist called to report high readings on pt's BP cuff. He did note difficulty in obtaining these via manual cuff, and used an automated device in patient's home. Unsure if either was fit well.  We discussed possible inaccuracy of readings - 1st reading showed elevation of 145/118, repeat was 118/70.  2nd concern was a high HR -- this was variable between 114-140.  I discussed w/ him - this patient has been seen by us recently, had monitor which was indicative for SVT. Also has hx of anxiety.  Inquired about symptoms - pt asymptomatic w/e of back pain. He is currently "mostly in bed" and sedentary s/p fall and back injury. Acknowledged that back pain could contribute to BP elevations as well as tachycardia. Pt had been given recommendation by us to follow up w PCP [facility] Royal[facility]rico Jones, f/u w us as needed. I recommended to do so at this time.   Physical Therapist states that Dr. Yetta BarreJones informed him he was waiting on our monitor report and that this was never received. I will try to get in touch w/ Dr. Yetta BarreJones to see how we can get the report to him.  Physical Therapist aware I will see if Dr. Antoine PocheHochrein has further recommendations for patient & follow up accordingly.

## 2015-09-09 NOTE — Telephone Encounter (Signed)
NEW MESSAGE    Pt c/o BP issue: STAT if pt c/o blurred vision, one-sided weakness or slurred speech  1. What are your last 5 BP readings? 145/118 SITTING RESTING  WAS 118/78 HR 114 to about 140  2. Are you having any other symptoms (ex. Dizziness, headache, blurred vision, passed out)?NO  3. What is your BP issue? ELEVATED

## 2015-09-09 NOTE — Telephone Encounter (Signed)
No answer. Left message to call back.   

## 2015-09-11 NOTE — Telephone Encounter (Signed)
The patient did have episodes of SVT.  I need to see him back in the office to discuss possible treatment.  Sounds like the BP monitor is not accurate.  He had an appt with me but cancelled it.  He needs to reschedule.

## 2015-09-12 ENCOUNTER — Telehealth: Payer: Self-pay | Admitting: Cardiology

## 2015-09-12 ENCOUNTER — Other Ambulatory Visit (HOSPITAL_COMMUNITY): Payer: BC Managed Care – PPO

## 2015-09-13 ENCOUNTER — Other Ambulatory Visit (HOSPITAL_COMMUNITY): Payer: BC Managed Care – PPO

## 2015-09-13 NOTE — Telephone Encounter (Signed)
Pt have appt with Dr Antoine PocheHochrein 09/07@ 7:30am

## 2015-09-14 ENCOUNTER — Other Ambulatory Visit (HOSPITAL_COMMUNITY): Payer: BC Managed Care – PPO

## 2015-09-15 ENCOUNTER — Other Ambulatory Visit (HOSPITAL_COMMUNITY): Payer: BC Managed Care – PPO

## 2015-09-16 ENCOUNTER — Other Ambulatory Visit (HOSPITAL_COMMUNITY): Payer: BC Managed Care – PPO

## 2015-09-19 NOTE — Progress Notes (Signed)
Cardiology Office Note   Date:  09/22/2015   ID:  [name], DOB 24-Jul-1985, MRN [id number]  PCP:  Paulino Rily, MD  Cardiologist:   Rollene Rotunda, MD   Chief Complaint  Patient presents with  . SVT      History of Present Illness: Brandon Morales is a 30 y.o. male who presents for evaluation of a TIA.   He was hospitalized in [place]. After his last visit with me I was able to get results from the outside hospital. Interestingly he was given thrombolytics for CVA when he went to the emergency room although he had no findings of abnormality on the CT. He couldn't get an MRI because of his body size. Echo was low quality. It was suggested that he have a TEE with bubble. Also an event monitor was requested.  I ordered a TEE and this was unremarkable with a low normal EF.  Event monitor demonstrated SVT.  He was to have follow up with me recently but canceled this secondary to back pain.  He did call and reported elevated BPs.  This was obtained by a PT and they had difficulty obtaining the readings.  We questioned the results.   Since I last saw him he was in the hospital with intractable low back pain.    He has had a significant run around from his back and is still in pain.  He returns today with significant back pain.  His heart rate is elevated.  He has noticed this.  He does not have any acute cardiac complaints.  The patient denies any new symptoms such as chest discomfort, neck or arm discomfort. There has been no new shortness of breath, PND or orthopnea. There have been no reported palpitations, presyncope or syncope.  He is in the office in a wheelchair.  Past Medical History:  Diagnosis Date  . Anxiety   . Asthma   . Depression   . Diabetes mellitus without complication (HCC)   . Hyperglycemia   . OSA (obstructive sleep apnea)   . TIA (transient ischemic attack) 07/20/2015    Past Surgical History:  Procedure Laterality Date  . TEE WITHOUT CARDIOVERSION  N/A 07/28/2015   Procedure: TRANSESOPHAGEAL ECHOCARDIOGRAM (TEE);  Surgeon: Pricilla Riffle, MD;  Location: [facility] ENDOSCOPY;  Service: Cardiovascular;  Laterality: N/A;  WITH BUBBLE  . TONSILLECTOMY       Current Outpatient Prescriptions  Medication Sig Dispense Refill  . acetaminophen-codeine (TYLENOL #3) 300-30 MG tablet Take 1 tablet by mouth every 8 (eight) hours as needed.     Marland Kitchen albuterol (PROVENTIL HFA;VENTOLIN HFA) 108 (90 BASE) MCG/ACT inhaler Inhale 2 puffs into the lungs every 6 (six) hours as needed. (Patient taking differently: Inhale 2 puffs into the lungs every 6 (six) hours as needed for wheezing. ) 1 Inhaler 11  . albuterol (PROVENTIL) (2.5 MG/3ML) 0.083% nebulizer solution Take 3 mLs (2.5 mg total) by nebulization every 6 (six) hours as needed. (Patient taking differently: Take 2.5 mg by nebulization every 6 (six) hours as needed for wheezing. ) 75 mL 11  . ALPRAZolam (XANAX) 0.5 MG tablet Take 0.5 mg by mouth at bedtime.     . cyclobenzaprine (FLEXERIL) 5 MG tablet Take 5 mg by mouth daily as needed for muscle spasms.    . divalproex (DEPAKOTE ER) 500 MG 24 hr tablet Take 500 mg by mouth daily.    . DULERA 100-5 MCG/ACT AERO Inhale 2 puffs into the lungs daily as needed for wheezing.     Marland Kitchen  ibuprofen (ADVIL,MOTRIN) 800 MG tablet Take 800 mg by mouth every 8 (eight) hours as needed for moderate pain.     . Ibuprofen-Diphenhydramine HCl (ADVIL PM) 200-25 MG CAPS Take 1 capsule by mouth at bedtime as needed (sleep).    . meloxicam (MOBIC) 15 MG tablet Take 15 mg by mouth daily as needed for pain.    . metFORMIN (GLUCOPHAGE) 500 MG tablet Take 1 tablet (500 mg total) by mouth 2 (two) times daily with a meal. 60 tablet 2  . PARoxetine (PAXIL) 20 MG tablet Take 20 mg by mouth daily.     No current facility-administered medications for this visit.     Allergies:   Erythromycin and Zithromax [azithromycin dihydrate]    ROS:  Please see the history of present illness.   Otherwise, review  of systems are positive for back pain .   All other systems are reviewed and negative.    PHYSICAL EXAM: VS:  BP 134/90   Pulse (!) 146   Ht 6\' 6"  (1.981 m)   Wt (!) 452 lb (205 kg)   SpO2 97%   BMI 52.23 kg/m  , BMI Body mass index is 52.23 kg/m. PHYSICAL EXAM GEN:  No distress NECK:  No jugular venous distention at 90 degrees, waveform within normal limits, carotid upstroke brisk and symmetric, no bruits, no thyromegaly LUNGS:  Clear to auscultation bilaterally CHEST:  Unremarkable HEART:  S1 and S2 within normal limits, no S3, no S4, no clicks, no rubs, no murmurs ABD:  Positive bowel sounds normal in frequency in pitch, no bruits, no rebound, no guarding, unable to assess midline mass or bruit with the patient seated. EXT:  2 plus pulses throughout, moderate edema, no cyanosis no clubbing SKIN:  No rashes no nodules   EKG:  EKG is ordered today. The ekg ordered today demonstratessinus tachycardia, rate 145, axis rightward, intervals within normal limits, no acute ST-T wave changes.   Recent Labs: 06/20/2015: ALT 28 07/25/2015: TSH 3.04 08/21/2015: BUN 14; Creatinine, Ser 0.80; Hemoglobin 12.9; Platelets 184; Potassium 3.6; Sodium 141    Lipid Panel No results found for: CHOL, TRIG, HDL, CHOLHDL, VLDL, LDLCALC, LDLDIRECT    Wt Readings from Last 3 Encounters:  09/22/15 (!) 452 lb (205 kg)  08/21/15 (!) 452 lb (205 kg)  08/11/15 (!) 452 lb (205 kg)      Other studies Reviewed: Additional studies/ records that were reviewed today include: . Review of the above records demonstrates:  Please see elsewhere in the note.     ASSESSMENT AND PLAN:  SVT:  Today he is in sinus tach likely related to pain.  I am going to start with Toprol XL 50 mg and increased as needed.  I will need to repeat a monitor in the future.  He will need to keep his heart rate and his BP at home and we talked about several ways to do this.  Of note his thyroid was OK in Sept.    TIA:  I reviewed  [place] records.  I see no clear etiology to his TIA.  No further work up is planned  OBESITY:  The patient understands the need to lose weight with diet and exercise. We have discussed specific strategies for this.  However, he is now limited by his back pain.    Current medicines are reviewed at length with the patient today.  The patient does not have concerns regarding medicines.  The following changes have been made:  no change  Labs/ tests ordered today include:   No orders of the defined types were placed in this encounter.    Disposition:   FU with me or APP in one month.    Signed, Rollene RotundaJames Beva Remund, MD  09/22/2015 7:55 AM    [facility]

## 2015-09-20 ENCOUNTER — Encounter: Payer: Self-pay | Admitting: Cardiology

## 2015-09-22 ENCOUNTER — Ambulatory Visit (INDEPENDENT_AMBULATORY_CARE_PROVIDER_SITE_OTHER): Payer: BC Managed Care – PPO | Admitting: Cardiology

## 2015-09-22 ENCOUNTER — Encounter: Payer: Self-pay | Admitting: Cardiology

## 2015-09-22 VITALS — BP 134/90 | HR 146 | Ht 78.0 in | Wt >= 6400 oz

## 2015-09-22 DIAGNOSIS — R000 Tachycardia, unspecified: Secondary | ICD-10-CM

## 2015-09-22 MED ORDER — METOPROLOL SUCCINATE ER 50 MG PO TB24: 50.0000 mg | ORAL_TABLET | Freq: Every day | ORAL | 11 refills | Status: DC

## 2015-09-22 NOTE — Patient Instructions (Signed)
Medication Instructions:  START Toprol XL 50 mg daily  Labwork: None Ordered  Testing/Procedures: None Ordered  Follow-Up: Your physician recommends that you schedule a follow-up appointment in: 1 Month with APP   Any Other Special Instructions Will Be Listed Below (If Applicable).   If you need a refill on your cardiac medications before your next appointment, please call your pharmacy.

## 2015-09-22 NOTE — Addendum Note (Signed)
Addended by: Chana BodeGREEN, Edrik Rundle L on: 09/22/2015 08:49 AM   Modules accepted: Orders

## 2015-09-26 NOTE — Addendum Note (Signed)
Addended by: Barrie DunkerHOMAS, NYASHA N on: 09/26/2015 09:46 AM   Modules accepted: Orders

## 2015-10-25 ENCOUNTER — Ambulatory Visit: Payer: Self-pay | Admitting: Cardiology

## 2015-11-08 ENCOUNTER — Ambulatory Visit: Payer: BC Managed Care – PPO | Admitting: Physical Therapy

## 2015-11-10 ENCOUNTER — Encounter: Payer: Self-pay | Admitting: Cardiology

## 2015-11-10 ENCOUNTER — Ambulatory Visit (INDEPENDENT_AMBULATORY_CARE_PROVIDER_SITE_OTHER): Payer: BC Managed Care – PPO | Admitting: Cardiology

## 2015-11-10 DIAGNOSIS — Z6841 Body Mass Index (BMI) 40.0 and over, adult: Secondary | ICD-10-CM | POA: Diagnosis not present

## 2015-11-10 DIAGNOSIS — G4733 Obstructive sleep apnea (adult) (pediatric): Secondary | ICD-10-CM | POA: Diagnosis not present

## 2015-11-10 DIAGNOSIS — I471 Supraventricular tachycardia, unspecified: Secondary | ICD-10-CM | POA: Insufficient documentation

## 2015-11-10 DIAGNOSIS — G458 Other transient cerebral ischemic attacks and related syndromes: Secondary | ICD-10-CM

## 2015-11-10 DIAGNOSIS — E119 Type 2 diabetes mellitus without complications: Secondary | ICD-10-CM

## 2015-11-10 DIAGNOSIS — R000 Tachycardia, unspecified: Secondary | ICD-10-CM

## 2015-11-10 NOTE — Assessment & Plan Note (Signed)
Documented on Holter-beta blocker added

## 2015-11-10 NOTE — Progress Notes (Signed)
11/10/2015 Brandon Morales   08-24-1985  [id number]  Primary Physician Paulino Rily, MD Primary Cardiologist: Dr Antoine Poche  HPI:  30 y/o morbidly obese Caucasian, works as a Corporate treasurer at [employer], with a history of suspected TIA in [place] June 2017. He apparently received thrombolytics. He was too big for an MRI there but a follow up MRI done at [facility] neurological in July 2017 was normal. A TEE done 07/28/15 was unremarkable. A Holter documented PSVT and beta blocker was added 09/22/15. He is in the office today for follow up. In there interm he had back surgery at [facility] 10/11/15. He is to start PT next week. He denies any tachycardia. He was to have a sleep study but the back surgery has delayed this.    Current Outpatient Prescriptions  Medication Sig Dispense Refill  . albuterol (PROVENTIL HFA;VENTOLIN HFA) 108 (90 BASE) MCG/ACT inhaler Inhale 2 puffs into the lungs every 6 (six) hours as needed. (Patient taking differently: Inhale 2 puffs into the lungs every 6 (six) hours as needed for wheezing. ) 1 Inhaler 11  . albuterol (PROVENTIL) (2.5 MG/3ML) 0.083% nebulizer solution Take 3 mLs (2.5 mg total) by nebulization every 6 (six) hours as needed. (Patient taking differently: Take 2.5 mg by nebulization every 6 (six) hours as needed for wheezing. ) 75 mL 11  . ALPRAZolam (XANAX) 0.5 MG tablet Take 0.5 mg by mouth at bedtime.     . cyclobenzaprine (FLEXERIL) 5 MG tablet Take 5 mg by mouth daily as needed for muscle spasms.    . divalproex (DEPAKOTE ER) 500 MG 24 hr tablet Take 500 mg by mouth daily.    . DULERA 100-5 MCG/ACT AERO Inhale 2 puffs into the lungs daily as needed for wheezing.     Marland Kitchen ibuprofen (ADVIL,MOTRIN) 800 MG tablet Take 800 mg by mouth every 8 (eight) hours as needed for moderate pain.     . Ibuprofen-Diphenhydramine HCl (ADVIL PM) 200-25 MG CAPS Take 1 capsule by mouth at bedtime as needed (sleep).    . meloxicam (MOBIC) 15 MG  tablet Take 15 mg by mouth daily as needed for pain.    . metFORMIN (GLUCOPHAGE) 500 MG tablet Take 1 tablet (500 mg total) by mouth 2 (two) times daily with a meal. 60 tablet 2  . metoprolol succinate (TOPROL-XL) 50 MG 24 hr tablet Take 1 tablet (50 mg total) by mouth daily. Take with or immediately following a meal. 30 tablet 11  . PARoxetine (PAXIL) 20 MG tablet Take 20 mg by mouth daily.     No current facility-administered medications for this visit.     Allergies  Allergen Reactions  . Erythromycin     Hives, itching   . Zithromax [Azithromycin Dihydrate]     unknown    Social History   Social History  . Marital status: Single    Spouse name: N/A  . Number of children: 0  . Years of education: Some coll   Occupational History  . Corporate treasurer    Social History Main Topics  . Smoking status: Former Smoker    Quit date: 03/10/2008  . Smokeless tobacco: Never Used  . Alcohol use No  . Drug use: No  . Sexual activity: No   Other Topics Concern  . Not on file   Social History Narrative   Mother lives with the patient.    Right-handed   Drinks about 4 cups of coffee or tea per day  Review of Systems: General: negative for chills, fever, night sweats or weight changes.  Cardiovascular: negative for chest pain, dyspnea on exertion, edema, orthopnea, palpitations, paroxysmal nocturnal dyspnea or shortness of breath Dermatological: negative for rash Respiratory: negative for cough or wheezing Urologic: negative for hematuria Abdominal: negative for nausea, vomiting, diarrhea, bright red blood per rectum, melena, or hematemesis Neurologic: negative for visual changes, syncope, or dizziness All other systems reviewed and are otherwise negative except as noted above.    Blood pressure 132/76, pulse (!) 120, height 6\' 6"  (1.981 m), weight (!) 428 lb (194.1 kg), SpO2 99 %.  General appearance: alert, cooperative, no distress, morbidly obese and using a  walker Neck: no carotid bruit and no JVD Lungs: clear to auscultation bilaterally Heart: regular rate and rhythm Skin: Skin color, texture, turgor normal. No rashes or lesions Neurologic: Grossly normal  EKG NSR, ST-116  TSH-WNL July 2017  ASSESSMENT AND PLAN:   TIA (transient ischemic attack) Seen in [place] and treated with lytics. No MRI done secondary to obesity, CT was negative, TEE unremarkable  PSVT (paroxysmal supraventricular tachycardia) (HCC) Documented on Holter-beta blocker added  BMI 50.0-59.9, adult (HCC) Morbid obesity-BMI 49  OSA (obstructive sleep apnea) C-pap  Diabetes mellitus without complication (HCC) On oral agents  Sinus tachycardia Asymptomatic   PLAN  The pt was tachycardic (141) when Dr Antoine PocheHochrein saw him 09/22/15. The pt also was having lots of problems with back pain then. He has since had back surgery and is better but still recovering. He is taking his Toprol as directed. His HR is much better today and is probably appropriate for him as he is still uncomfortable s/p back surgery. He has morbid obesity and sleep apnea by history and I encouraged him to follow through with a sleep study when he has recovered from his back surgery. F/U in one year or sooner as needed.   Corine ShelterLuke Ahtziry Saathoff PA-C 11/10/2015 10:21 AM

## 2015-11-10 NOTE — Assessment & Plan Note (Signed)
Asymptomatic. 

## 2015-11-10 NOTE — Patient Instructions (Signed)
Medication Instructions:  Your physician recommends that you continue on your current medications as directed. Please refer to the Current Medication list given to you today.  Labwork: None   Testing/Procedures: None   Follow-Up: Your physician wants you to follow-up in: 12 months with Dr Hochrein. You will receive a reminder letter in the mail two months in advance. If you don't receive a letter, please call our office to schedule the follow-up appointment.  Any Other Special Instructions Will Be Listed Below (If Applicable).     If you need a refill on your cardiac medications before your next appointment, please call your pharmacy.  

## 2015-11-10 NOTE — Assessment & Plan Note (Addendum)
[place] in [place] and treated with lytics. No MRI done secondary to obesity, CT was negative, TEE unremarkable

## 2015-11-10 NOTE — Assessment & Plan Note (Signed)
On oral agents 

## 2015-11-10 NOTE — Assessment & Plan Note (Signed)
Cpap

## 2015-11-10 NOTE — Assessment & Plan Note (Signed)
Morbid obesity-BMI 49

## 2015-11-14 ENCOUNTER — Ambulatory Visit: Payer: BC Managed Care – PPO | Attending: Physical Medicine and Rehabilitation | Admitting: Rehabilitation

## 2015-11-14 DIAGNOSIS — M6281 Muscle weakness (generalized): Secondary | ICD-10-CM

## 2015-11-14 DIAGNOSIS — M5440 Lumbago with sciatica, unspecified side: Secondary | ICD-10-CM | POA: Diagnosis present

## 2015-11-14 DIAGNOSIS — R2681 Unsteadiness on feet: Secondary | ICD-10-CM | POA: Insufficient documentation

## 2015-11-14 DIAGNOSIS — R2689 Other abnormalities of gait and mobility: Secondary | ICD-10-CM | POA: Diagnosis present

## 2015-11-14 NOTE — Therapy (Signed)
[facility] [address] [place], [place], [address] Phone: [phone]   Fax:  [phone]  Physical Therapy Evaluation  Patient Details  Name: Brandon Morales MRN: [id number] Date of Birth: 11/25/1985 Referring Provider: Ihor AustinMatthew James Parker, MD  Encounter Date: 11/14/2015      PT End of Session - 11/14/15 1001    Visit Number 1   Number of Visits 7   Date for PT Re-Evaluation 01/13/16   Authorization Type BCBS (will email Misty StanleyLisa to get benefit details)   PT Start Time 0848   PT Stop Time 0933   PT Time Calculation (min) 45 min   Activity Tolerance Patient limited by pain   Behavior During Therapy [facility] for tasks assessed/performed      Past Medical History:  Diagnosis Date  . Anxiety   . Asthma   . Depression   . Diabetes mellitus without complication (HCC)   . Hyperglycemia   . OSA (obstructive sleep apnea)   . TIA (transient ischemic attack) 07/20/2015    Past Surgical History:  Procedure Laterality Date  . TEE WITHOUT CARDIOVERSION N/A 07/28/2015   Procedure: TRANSESOPHAGEAL ECHOCARDIOGRAM (TEE);  Surgeon: Pricilla RifflePaula V Ross, MD;  [facility];  Service: Cardiovascular;  Laterality: N/A;  WITH BUBBLE  . TONSILLECTOMY      There were no vitals filed for this visit.       Subjective Assessment - 11/14/15 0852    Subjective "My back went out in August.  I just recently had surgery to fix it."    Patient is accompained by: Family member  Deloris   Limitations House hold activities;Walking   How long can you stand comfortably? about 4 mins   How long can you walk comfortably? varies, up to 5 mins (states he could probably walk around building.   Patient Stated Goals "I want to be able to walk without the walker."    Currently in Pain? Yes   Pain Score 5    Pain Location Back   Pain Orientation Lower;Mid   Pain Descriptors / Indicators Sore   Pain Type Acute pain   Pain Onset More than a  month ago   Pain Frequency Intermittent   Aggravating Factors  falling, sleeping   Pain Relieving Factors repositioning, pain medication only if needed            [facility] - 11/14/15 0858      Assessment   Medical Diagnosis back pain   Referring Provider Ihor AustinMatthew James Parker, MD   Onset Date/Surgical Date 10/11/15   Prior Therapy [facility]     Precautions   Precautions Fall;Back   Precaution Comments abides by back precautions as much as possible     Restrictions   Weight Bearing Restrictions No     Balance Screen   Has the patient fallen in the past 6 months Yes   How many times? 1   Has the patient had a decrease in activity level because of a fear of falling?  No   Is the patient reluctant to leave their home because of a fear of falling?  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, excessive standing and walking, 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, handle weapons, breaking up fights as needed   Leisure Likes to read     Cognition   Overall Cognitive Status Within Functional Limits for tasks assessed     Sensation   Light Touch Impaired Detail   Light Touch Impaired Details Impaired RLE;Impaired LLE  slight tingling in feet   Hot/Cold Appears Intact   Proprioception Appears Intact     Coordination   Gross Motor Movements are Fluid and Coordinated Yes  in LEs   Fine Motor Movements are Fluid and Coordinated Yes  in LEs     ROM / Strength   AROM /  PROM / Strength Strength     Strength   Overall Strength Within functional limits for tasks performed  hip flex 4/5, otherwise 5/5     Bed Mobility   Bed Mobility Not assessed  not able to lie on mat due to pain     Transfers   Transfers Sit to Stand;Stand to Sit   Sit to Stand 6: Modified independent (Device/Increase time)   Five time sit to stand comments  19.34 secs with hands on lap   Stand to Sit 6: Modified independent (Device/Increase time)     Ambulation/Gait   Ambulation/Gait Yes   Ambulation/Gait Assistance 6: Modified independent (Device/Increase time);5: Supervision  S with quad cane   Ambulation/Gait Assistance Details Cues for appropriate use of LBQC during session.    Ambulation Distance (Feet) 115 Feet  x 2   Assistive device Rolling walker;Large base quad cane   Gait Pattern Step-through pattern;Decreased stride length;Trunk flexed;Wide base of support   Ambulation Surface Level;Indoor   Gait velocity 2.20 ft/sec with RW   Stairs Yes   Stairs Assistance 4: Min assist   Stairs Assistance Details (indicate cue type and reason) cues for safety   Stair Management Technique Two rails;Alternating pattern;Step to pattern;Forwards   Number of Stairs 4   Height of Stairs 6                           PT Education - 11/14/15 1001    Education provided Yes   Education Details evaluation findings, POC, goals, getting quad cane as able to begin gait in home only with this device.     Person(s) Educated Patient;Parent(s)   Methods Explanation   Comprehension Verbalized understanding          PT Short Term Goals - 11/14/15 1008      PT SHORT TERM GOAL #1   Title Pt will initiate HEP in order to indicate improved functional mobility.  (Target Date: 12/05/15)   Time 3   Period Weeks   Status New     PT SHORT TERM GOAL #2   Title Pt will ambulate x 150' with SPC over indoor surfaces at S level in order to indicate improved independence in home.     Time 3   Period Weeks   Status New     PT SHORT TERM GOAL #3   Title Will assess w/ LRAD and improve distance by 35' in order to indicate improved functional endurance.    Time 3   Period Weeks   Status New  PT SHORT TERM GOAL #4   Title Pt will report no more than 4/10 pain in low back to indicate pain is decreased limiting factor in mobility.     Time 3   Period Weeks   Status New           PT Long Term Goals - 11/14/15 1011      PT LONG TERM GOAL #1   Title Pt will be independent with HEP in order to indicate improved functional mobility.  (Target Date: 12/26/15)   Time 6   Period Weeks   Status New     PT LONG TERM GOAL #2   Title Pt will ambulate with gait speed of 2.62 ft/sec in order to indicate safe community ambulator.     Time 6   Period Weeks   Status New     PT LONG TERM GOAL #3   Title Pt will perform 8/10 sit<>stand without UE support in order to indicate improved functional strength.     Time 6   Period Weeks   Status New     PT LONG TERM GOAL #4   Title Pt will ambulate 1000' w/ SPC at mod I level over paved unlevel outdoor surfaces in order to indicate improved functional mobility.    Time 6   Period Weeks   Status New     PT LONG TERM GOAL #5   Title Pt will ambulate 300' without AD over indoor surfaces at mod I level in order to indicate increased independence at home.     Time 6   Period Weeks   Status New     Additional Long Term Goals   Additional Long Term Goals Yes     PT LONG TERM GOAL #6   Title Pt will verbalize plans to return to community fitness following PT in order to maintain gains made in therapy.    Time 6   Period Weeks   Status New     PT LONG TERM GOAL #7   Title Pt will report no more than 3/10 pain in low back in order to indicate that pain is decreased limiting factor in mobility.    Time 6   Period Weeks   Status New               Plan - 11/14/15 1003    Clinical Impression Statement Pt  presents s/p L2/3 microdiscectomy on 10/11/15 with residual LBP and generalized weakness and decreased balance/mobility.  Note pt is morbidly obese and unable to return to to work at this time due to deficits.  Upon PT evaluation, note that gait speed is 2.20 ft/sec, slower than community ambulator, and 5TSS time of 19.34 with UE support on legs, indicative of decreased balance and functional strength.  Pt is of evolving presentation and moderate complexity per PT POC standpoint.  Pt will benefit from skilled OP neuro PT in order to address deficits.     Rehab Potential Good   Clinical Impairments Affecting Rehab Potential compliance and co-morbidities   PT Frequency 1x / week   PT Duration 6 weeks   PT Treatment/Interventions ADLs/Self Care Home Management;Moist Heat;DME Instruction;Gait training;Stair training;Functional mobility training;Therapeutic activities;Therapeutic exercise;Balance training;Neuromuscular re-education;Patient/family education;Energy conservation;Vestibular   PT Next Visit Plan 6MWT Continue to gait train with varying devices (okayed him to use [facility] in home if he gets one-could try outdoor surfaces to ensure safety), begin sitting/standing HEP for BLE strength.    Consulted and Agree with Plan of  Care Patient;Family member/caregiver   Family Member Consulted Mother      Patient will benefit from skilled therapeutic intervention in order to improve the following deficits and impairments:  Decreased activity tolerance, Decreased balance, Decreased endurance, Decreased knowledge of precautions, Decreased knowledge of use of DME, Decreased mobility, Decreased safety awareness, Decreased strength, Difficulty walking, Impaired perceived functional ability, Impaired flexibility, Impaired sensation, Improper body mechanics, Postural dysfunction, Obesity, Pain  Visit Diagnosis: Unsteadiness on feet - Plan: PT plan of care cert/re-cert  Acute bilateral low back pain with sciatica,  sciatica laterality unspecified - Plan: PT plan of care cert/re-cert  Other abnormalities of gait and mobility - Plan: PT plan of care cert/re-cert  Muscle weakness (generalized)     Problem List Patient Active Problem List   Diagnosis Date Noted  . PSVT (paroxysmal supraventricular tachycardia) (HCC) 11/10/2015  . Sinus tachycardia 11/10/2015  . Intractable low back pain 08/21/2015  . Lumbar radiculopathy 08/21/2015  . Intractable back pain 08/21/2015  . Diabetes mellitus without complication (HCC)   . OSA (obstructive sleep apnea)   . Generalized anxiety disorder 07/26/2015    Class: Chronic  . Persistent mood (affective) disorder, unspecified 07/26/2015    Class: Chronic  . TIA (transient ischemic attack) 07/20/2015  . Mood disorder (HCC) 07/04/2015  . Anxiety state 07/04/2015  . BMI 50.0-59.9, adult (HCC) 07/20/2011  . Asthma 03/11/2011    Harriet Butte, PT, MPT [facility] [address] [place], [place], [address] Phone: [phone]   Fax:  [phone] 11/14/15, 12:49 PM  Name: Brandon Morales MRN: [id number] Date of Birth: 07/01/1985

## 2015-11-16 NOTE — Telephone Encounter (Signed)
Closed encounter °

## 2015-11-22 ENCOUNTER — Ambulatory Visit: Payer: BC Managed Care – PPO | Admitting: Physical Therapy

## 2015-11-23 ENCOUNTER — Encounter: Payer: Self-pay | Admitting: Physical Therapy

## 2015-11-23 ENCOUNTER — Ambulatory Visit: Payer: BC Managed Care – PPO | Attending: Physical Medicine and Rehabilitation | Admitting: Physical Therapy

## 2015-11-23 DIAGNOSIS — R2689 Other abnormalities of gait and mobility: Secondary | ICD-10-CM

## 2015-11-23 DIAGNOSIS — M6281 Muscle weakness (generalized): Secondary | ICD-10-CM | POA: Diagnosis present

## 2015-11-23 DIAGNOSIS — R2681 Unsteadiness on feet: Secondary | ICD-10-CM | POA: Insufficient documentation

## 2015-11-23 DIAGNOSIS — M5440 Lumbago with sciatica, unspecified side: Secondary | ICD-10-CM | POA: Diagnosis present

## 2015-11-23 NOTE — Therapy (Signed)
[facility] [address] [place], [place], [address] Phone: [phone]   Fax:  [phone]  Physical Therapy Treatment  Patient Details  Name: Brandon JacksBenjamin Morales MRN: [id number] Date of Birth: 06/10/1985 Referring Provider: Ihor AustinMatthew James Parker, MD  Encounter Date: 11/23/2015      PT End of Session - 11/23/15 1453    Visit Number 2   Number of Visits 7   Date for PT Re-Evaluation 01/13/16   Authorization Type BCBS (will email Misty StanleyLisa to get benefit details)   PT Start Time 1448   PT Stop Time 1513  ended early due to medical issues   PT Time Calculation (min) 25 min   Behavior During Therapy [facility] for tasks assessed/performed      Past Medical History:  Diagnosis Date  . Anxiety   . Asthma   . Depression   . Diabetes mellitus without complication (HCC)   . Hyperglycemia   . OSA (obstructive sleep apnea)   . TIA (transient ischemic attack) 07/20/2015    Past Surgical History:  Procedure Laterality Date  . TEE WITHOUT CARDIOVERSION N/A 07/28/2015   Procedure: TRANSESOPHAGEAL ECHOCARDIOGRAM (TEE);  Surgeon: Pricilla RifflePaula V Ross, MD;  [facility];  Service: Cardiovascular;  Laterality: N/A;  WITH BUBBLE  . TONSILLECTOMY      There were no vitals filed for this visit.      Subjective Assessment - 11/23/15 1452    Subjective No new complaints. No new falls or pain to report.    Patient is accompained by: Family member  mom, Deloris   Limitations House hold activities;Walking   How long can you stand comfortably? about 4 mins   How long can you walk comfortably? varies, up to 5 mins (states he could probably walk around building.   Patient Stated Goals "I want to be able to walk without the walker."    Currently in Pain? No/denies   Pain Score 0-No pain            [facility] PT Assessment - 11/23/15 1456      6 Minute Walk- Baseline   6 Minute Walk- Baseline yes   BP (mmHg) 145/90   HR (bpm) 131   02  Sat (%RA) 97 %   Modified Borg Scale for Dyspnea 0- Nothing at all   Perceived Rate of Exertion (Borg) 6-     6 Minute walk- Post Test   6 Minute Walk Post Test yes            [facility] Adult PT Treatment/Exercise - 11/23/15 1510      Transfers   Transfers Sit to Stand;Stand to Sit   Sit to Stand 6: Modified independent (Device/Increase time)   Stand to Sit 6: Modified independent (Device/Increase time)     Ambulation/Gait   Ambulation/Gait Yes   Ambulation/Gait Assistance 6: Modified independent (Device/Increase time)   Ambulation/Gait Assistance Details pt's HR increased to 142 bpm (from 131 seated edge of mat after ~5 minutes).     Ambulation Distance (Feet) 80 Feet   Assistive device Rolling walker   Gait Pattern Step-through pattern;Decreased stride length;Trunk flexed;Wide base of support   Ambulation Surface Level;Indoor                PT Education - 11/23/15 1512    Education provided Yes   Education Details importance of taking all medications as prescribed   Person(s) Educated Patient;Parent(s)   Methods Explanation;Verbal cues   Comprehension Verbalized understanding;Returned demonstration;Need further instruction  PT Short Term Goals - 11/14/15 1008      PT SHORT TERM GOAL #1   Title Pt will initiate HEP in order to indicate improved functional mobility.  (Target Date: 12/05/15)   Time 3   Period Weeks   Status New     PT SHORT TERM GOAL #2   Title Pt will ambulate x 150' with SPC over indoor surfaces at S level in order to indicate improved independence in home.    Time 3   Period Weeks   Status New     PT SHORT TERM GOAL #3   Title Will assess 6MWT w/ LRAD and improve distance by 5275' in order to indicate improved functional endurance.    Time 3   Period Weeks   Status New     PT SHORT TERM GOAL #4   Title Pt will report no more than 4/10 pain in low back to indicate pain is decreased limiting factor in mobility.     Time 3    Period Weeks   Status New           PT Long Term Goals - 11/14/15 1011      PT LONG TERM GOAL #1   Title Pt will be independent with HEP in order to indicate improved functional mobility.  (Target Date: 12/26/15)   Time 6   Period Weeks   Status New     PT LONG TERM GOAL #2   Title Pt will ambulate with gait speed of 2.62 ft/sec in order to indicate safe community ambulator.     Time 6   Period Weeks   Status New     PT LONG TERM GOAL #3   Title Pt will perform 8/10 sit<>stand without UE support in order to indicate improved functional strength.     Time 6   Period Weeks   Status New     PT LONG TERM GOAL #4   Title Pt will ambulate 1000' w/ SPC at mod I level over paved unlevel outdoor surfaces in order to indicate improved functional mobility.    Time 6   Period Weeks   Status New     PT LONG TERM GOAL #5   Title Pt will ambulate 300' without AD over indoor surfaces at mod I level in order to indicate increased independence at home.     Time 6   Period Weeks   Status New     Additional Long Term Goals   Additional Long Term Goals Yes     PT LONG TERM GOAL #6   Title Pt will verbalize plans to return to community fitness following PT in order to maintain gains made in therapy.    Time 6   Period Weeks   Status New     PT LONG TERM GOAL #7   Title Pt will report no more than 3/10 pain in low back in order to indicate that pain is decreased limiting factor in mobility.    Time 6   Period Weeks   Status New           Plan - 11/23/15 1455    Clinical Impression Statement Today's skilled session was limited by pt's HR increasing to unsafe levels with activity. Pt reported once 1st HR reading was obtained in prep for 6 minute walk test that he has not taken his heart medication in ~3 days due to "i forgot". Attempted short distance gait as pt reports his resting HR is  high anyway with HR increasing by 12 beats. Remainder of session cancelled. Pt is to go home  and take his medication and get back on schedules. Reviewed symptoms of cardicac arrest and CVA with pt/mom and risks associated with increased HR. Pt should benefit from continued PT to progress toward unmet goals.                                             Rehab Potential Good   Clinical Impairments Affecting Rehab Potential compliance and co-morbidities   PT Frequency 1x / week   PT Duration 6 weeks   PT Treatment/Interventions ADLs/Self Care Home Management;Moist Heat;DME Instruction;Gait training;Stair training;Functional mobility training;Therapeutic activities;Therapeutic exercise;Balance training;Neuromuscular re-education;Patient/family education;Energy conservation;Vestibular   PT Next Visit Plan check HR. ask about what neurosurgeon had to say (sees him on Thursday 11/24/15, day before next apt here), perform 6 minute walk test. issue HEP for bil LE strengthening/balance; gait training LRAD.   Consulted and Agree with Plan of Care Patient;Family member/caregiver   Family Member Consulted Mother      Patient will benefit from skilled therapeutic intervention in order to improve the following deficits and impairments:  Decreased activity tolerance, Decreased balance, Decreased endurance, Decreased knowledge of precautions, Decreased knowledge of use of DME, Decreased mobility, Decreased safety awareness, Decreased strength, Difficulty walking, Impaired perceived functional ability, Impaired flexibility, Impaired sensation, Improper body mechanics, Postural dysfunction, Obesity, Pain  Visit Diagnosis: Unsteadiness on feet  Acute bilateral low back pain with sciatica, sciatica laterality unspecified  Other abnormalities of gait and mobility  Muscle weakness (generalized)     Problem List Patient Active Problem List   Diagnosis Date Noted  . PSVT (paroxysmal supraventricular tachycardia) (HCC) 11/10/2015  . Sinus tachycardia 11/10/2015  . Intractable low back pain 08/21/2015  .  Lumbar radiculopathy 08/21/2015  . Intractable back pain 08/21/2015  . Diabetes mellitus without complication (HCC)   . OSA (obstructive sleep apnea)   . Generalized anxiety disorder 07/26/2015    Class: Chronic  . Persistent mood (affective) disorder, unspecified 07/26/2015    Class: Chronic  . TIA (transient ischemic attack) 07/20/2015  . Mood disorder (HCC) 07/04/2015  . Anxiety state 07/04/2015  . BMI 50.0-59.9, adult (HCC) 07/20/2011  . Asthma 03/11/2011    Sallyanne Kuster, PTA, [facility] [address], [address] [place], [place] [address] [phone] 11/23/15, 3:21 PM   Name: Willis Holquin MRN: [id number] Date of Birth: 1986-01-14

## 2015-11-25 ENCOUNTER — Ambulatory Visit: Payer: BC Managed Care – PPO | Admitting: Physical Therapy

## 2015-11-25 ENCOUNTER — Encounter: Payer: Self-pay | Admitting: Physical Therapy

## 2015-11-25 DIAGNOSIS — R2681 Unsteadiness on feet: Secondary | ICD-10-CM | POA: Diagnosis not present

## 2015-11-25 DIAGNOSIS — M6281 Muscle weakness (generalized): Secondary | ICD-10-CM

## 2015-11-25 DIAGNOSIS — R2689 Other abnormalities of gait and mobility: Secondary | ICD-10-CM

## 2015-11-25 DIAGNOSIS — M5440 Lumbago with sciatica, unspecified side: Secondary | ICD-10-CM

## 2015-11-25 NOTE — Therapy (Signed)
[facility] [address] [place], [place], [address] Phone: [phone]   Fax:  [phone]  Physical Therapy Treatment  Patient Details  Name: Brandon Morales MRN: [id number] Date of Birth: 01/28/1985 Referring Provider: Ihor AustinMatthew James Parker, MD  Encounter Date: 11/25/2015      PT End of Session - 11/25/15 0853    Visit Number 3   Number of Visits 7   Date for PT Re-Evaluation 01/13/16   Authorization Type BCBS (will email Misty StanleyLisa to get benefit details)   PT Start Time 0848   PT Stop Time 0930   PT Time Calculation (min) 42 min   Activity Tolerance No increased pain;Patient limited by fatigue   [facility] for tasks assessed/performed      Past Medical History:  Diagnosis Date  . Anxiety   . Asthma   . Depression   . Diabetes mellitus without complication (HCC)   . Hyperglycemia   . OSA (obstructive sleep apnea)   . TIA (transient ischemic attack) 07/20/2015    Past Surgical History:  Procedure Laterality Date  . TEE WITHOUT CARDIOVERSION N/A 07/28/2015   Procedure: TRANSESOPHAGEAL ECHOCARDIOGRAM (TEE);  Surgeon: Pricilla RifflePaula V Ross, MD;  [facility];  Service: Cardiovascular;  Laterality: N/A;  WITH BUBBLE  . TONSILLECTOMY      There were no vitals filed for this visit.      Subjective Assessment - 11/25/15 0850    Subjective No new complaints. No new falls or pain to report. Reports he just took his medication in the parking lot and states he has been taking it for the last few days as well (since last visit). "It's too early" and "I didn't sleep well last night" are the only complaints.                                                     Patient is accompained by: Family member  mom, Deloris- in lobby   Limitations House hold activities;Walking   How long can you stand comfortably? about 4 mins   How long can you walk comfortably? varies, up to 5 mins (states he could probably  walk around building.   Patient Stated Goals "I want to be able to walk without the walker."    Currently in Pain? No/denies   Pain Score 0-No pain            [facility] PT Assessment - 11/25/15 0852      6 Minute Walk- Baseline   6 Minute Walk- Baseline yes   BP (mmHg) (!)  110/91   HR (bpm) 104   02 Sat (%RA) 96 %   Modified Borg Scale for Dyspnea 0- Nothing at all   Perceived Rate of Exertion (Borg) 6-     6 Minute walk- Post Test   6 Minute Walk Post Test yes   BP (mmHg) (!)  119/94   HR (bpm) 129   02 Sat (%RA) 96 %   Modified Borg Scale for Dyspnea 2- Mild shortness of breath   Perceived Rate of Exertion (Borg) 12-     6 minute walk test results    Aerobic Endurance Distance Walked 801   Endurance additional comments no rest breaks taken, used RW. see gait section for more details on gait  [facility] Adult PT Treatment/Exercise - 11/25/15 0910      Ambulation/Gait   Ambulation/Gait Yes   Ambulation/Gait Assistance 6: Modified independent (Device/Increase time)   Ambulation Distance (Feet) --  see 6 minute walk test   Assistive device Rolling walker   Gait Pattern Step-through pattern;Decreased stride length;Narrow base of support;Trunk flexed;Poor foot clearance - left;Poor foot clearance - right   Ambulation Surface Level;Indoor     issued the following to HEP: Functional Quadriceps: Sit to Stand    Sit on edge of chair, feet flat on floor. Stand upright, extending knees fully for full upright posture. Sit back down in slow, controlled motion. Repeat __10__ times per set. Do _1_ sets per session. Do _1-2_ sessions per day.  http://orth.exer.us/734   Copyright  VHI. All rights reserved.     FUNCTIONAL MOBILITY: Squat    Stance: shoulder-width on floor. Bend hips and knees. Keep back straight. Do not allow knees to bend past toes. Squeeze glutes and quads to stand. __10_ reps per set, 1_ sets per day, _5_ days per week  Copyright  VHI. All  rights reserved.      Seated Hip Abduction with TB  Begin in a seated position with good posture and  both feet on the floor.  Tie an exercise band snugly around the knees, with both knees touching in the middle. Slowly push your knees outward into the band. Hold for 5 seconds, then return to the center.   KNEE: Extension, Long Arc Quad (Band)    Place band around legs at ankles. With left leg bent back underneath you, use the right leg to pull band forward until knee is straight. Hold _5__ seconds. Use ___green_____ band. Repeat with other leg. _10__ reps per set, __1_ sets per day, _5__ days per week  Copyright  VHI. All rights reserved.            PT Education - 11/25/15 0929    Education provided Yes   Education Details HEP: for LE strengthening    Person(s) Educated Patient   Methods Explanation;Demonstration;Verbal cues;Handout   Comprehension Verbalized understanding;Returned demonstration;Verbal cues required;Need further instruction          PT Short Term Goals - 11/14/15 1008      PT SHORT TERM GOAL #1   Title Pt will initiate HEP in order to indicate improved functional mobility.  (Target Date: 12/05/15)   Time 3   Period Weeks   Status New     PT SHORT TERM GOAL #2   Title Pt will ambulate x 150' with SPC over indoor surfaces at S level in order to indicate improved independence in home.    Time 3   Period Weeks   Status New     PT SHORT TERM GOAL #3   Title Will assess w/ LRAD and improve distance by 56' in order to indicate improved functional endurance.    Time 3   Period Weeks   Status New     PT SHORT TERM GOAL #4   Title Pt will report no more than 4/10 pain in low back to indicate pain is decreased limiting factor in mobility.     Time 3   Period Weeks   Status New           PT Long Term Goals - 11/14/15 1011      PT LONG TERM GOAL #1   Title Pt will be independent with HEP in order to indicate improved functional mobility.   (  Target Date: 12/26/15)   Time 6   Period Weeks   Status New     PT LONG TERM GOAL #2   Title Pt will ambulate with gait speed of 2.62 ft/sec in order to indicate safe community ambulator.     Time 6   Period Weeks   Status New     PT LONG TERM GOAL #3   Title Pt will perform 8/10 sit<>stand without UE support in order to indicate improved functional strength.     Time 6   Period Weeks   Status New     PT LONG TERM GOAL #4   Title Pt will ambulate 1000' w/ SPC at mod I level over paved unlevel outdoor surfaces in order to indicate improved functional mobility.    Time 6   Period Weeks   Status New     PT LONG TERM GOAL #5   Title Pt will ambulate 300' without AD over indoor surfaces at mod I level in order to indicate increased independence at home.     Time 6   Period Weeks   Status New     Additional Long Term Goals   Additional Long Term Goals Yes     PT LONG TERM GOAL #6   Title Pt will verbalize plans to return to community fitness following PT in order to maintain gains made in therapy.    Time 6   Period Weeks   Status New     PT LONG TERM GOAL #7   Title Pt will report no more than 3/10 pain in low back in order to indicate that pain is decreased limiting factor in mobility.    Time 6   Period Weeks   Status New            Plan - 11/25/15 0853    Clinical Impression Statement Today's skilled session addressed setting baseline value for 6 minute walk test and then issuing HEP for LE strengthening. Attempted to issue dynamic gait/balance activities today as well however pt stated he does not have a good place to perform them at home: does not go into kitchen (would not state why) and will not use RW as he is afraid a cat will get under his feet. Pt has been cleared for water aerobics from neurosurgeon and plans to check with [facility] to see if he can go there and use their pool. Pt is making slow progress toward goals. Continues to have increased HR  with activity, however better today vs last session. Pt should benefit from continued PT to progress toward unmet goals.                                                    Rehab Potential Good   Clinical Impairments Affecting Rehab Potential compliance and co-morbidities   PT Frequency 1x / week   PT Duration 6 weeks   PT Treatment/Interventions ADLs/Self Care Home Management;Moist Heat;DME Instruction;Gait training;Stair training;Functional mobility training;Therapeutic activities;Therapeutic exercise;Balance training;Neuromuscular re-education;Patient/family education;Energy conservation;Vestibular   PT Next Visit Plan check HR. continue with  bil LE strengthening/balance; gait training LRAD.   Consulted and Agree with Plan of Care Patient;Family member/caregiver   Family Member Consulted Mother      Patient will benefit from skilled therapeutic intervention in order to improve the following deficits and impairments:  Decreased activity tolerance, Decreased balance, Decreased endurance, Decreased knowledge of precautions, Decreased knowledge of use of DME, Decreased mobility, Decreased safety awareness, Decreased strength, Difficulty walking, Impaired perceived functional ability, Impaired flexibility, Impaired sensation, Improper body mechanics, Postural dysfunction, Obesity, Pain  Visit Diagnosis: Unsteadiness on feet  Acute bilateral low back pain with sciatica, sciatica laterality unspecified  Other abnormalities of gait and mobility  Muscle weakness (generalized)     Problem List Patient Active Problem List   Diagnosis Date Noted  . PSVT (paroxysmal supraventricular tachycardia) (HCC) 11/10/2015  . Sinus tachycardia 11/10/2015  . Intractable low back pain 08/21/2015  . Lumbar radiculopathy 08/21/2015  . Intractable back pain 08/21/2015  . Diabetes mellitus without complication (HCC)   . OSA (obstructive sleep apnea)   . Generalized anxiety disorder 07/26/2015    Class:  Chronic  . Persistent mood (affective) disorder, unspecified 07/26/2015    Class: Chronic  . TIA (transient ischemic attack) 07/20/2015  . Mood disorder (HCC) 07/04/2015  . Anxiety state 07/04/2015  . BMI 50.0-59.9, adult (HCC) 07/20/2011  . Asthma 03/11/2011    Sallyanne KusterKathy Lyndal Reggio, PTA, [facility] [address], [address] [place], [place] [address] [phone] 11/25/15, 9:42 AM   Name: Brandon Morales MRN: [id number] Date of Birth: 10/15/1985

## 2015-11-25 NOTE — Patient Instructions (Addendum)
Functional Quadriceps: Sit to Stand    Sit on edge of chair, feet flat on floor. Stand upright, extending knees fully for full upright posture. Sit back down in slow, controlled motion. Repeat __10__ times per set. Do _1_ sets per session. Do _1-2_ sessions per day.  http://orth.exer.us/734   Copyright  VHI. All rights reserved.     FUNCTIONAL MOBILITY: Squat    Stance: shoulder-width on floor. Bend hips and knees. Keep back straight. Do not allow knees to bend past toes. Squeeze glutes and quads to stand. __10_ reps per set, 1_ sets per day, _5_ days per week  Copyright  VHI. All rights reserved.      Seated Hip Abduction with TB  Begin in a seated position with good posture and  both feet on the floor.  Tie an exercise band snugly around the knees, with both knees touching in the middle. Slowly push your knees outward into the band. Hold for 5 seconds, then return to the center.   KNEE: Extension, Long Arc Quad (Band)    Place band around legs at ankles. With left leg bent back underneath you, use the right leg to pull band forward until knee is straight. Hold _5__ seconds. Use ___green_____ band. Repeat with other leg. _10__ reps per set, __1_ sets per day, _5__ days per week  Copyright  VHI. All rights reserved.

## 2015-12-02 ENCOUNTER — Ambulatory Visit: Payer: BC Managed Care – PPO | Admitting: Rehabilitation

## 2015-12-02 ENCOUNTER — Encounter: Payer: Self-pay | Admitting: Rehabilitation

## 2015-12-02 DIAGNOSIS — M5440 Lumbago with sciatica, unspecified side: Secondary | ICD-10-CM

## 2015-12-02 DIAGNOSIS — M6281 Muscle weakness (generalized): Secondary | ICD-10-CM

## 2015-12-02 DIAGNOSIS — R2689 Other abnormalities of gait and mobility: Secondary | ICD-10-CM

## 2015-12-02 DIAGNOSIS — R2681 Unsteadiness on feet: Secondary | ICD-10-CM

## 2015-12-02 NOTE — Patient Instructions (Signed)
Stand behind your chair with back against the wall.  Have the chair in front of you for support.     Feet Together, Arm Motion - Eyes Closed    With eyes closed and feet together, keep arms by your side.   Repeat __3__ times per session for 30 secs each. Do __2__ sessions per day.  Copyright  VHI. All rights reserved.    Feet Together, Head Motion - Eyes Closed    With eyes closed and feet together, move head slowly, up and down x 10 reps, side to side x 10 reps Repeat _1___ times per session. Do _2___ sessions per day.  Copyright  VHI. All rights reserved.

## 2015-12-02 NOTE — Therapy (Signed)
[facility] [address] [place], [place], [address] Phone: [phone]   Fax:  [phone]  Physical Therapy Treatment  Patient Details  Name: Brandon Morales MRN: [id number] Date of Birth: 09-15-1985 Referring Provider: Ihor Austin, MD  Encounter Date: 12/02/2015      PT End of Session - 12/02/15 1159    Visit Number 4   Number of Visits 7   Date for PT Re-Evaluation 01/13/16   Authorization Type BCBS (will email Misty Stanley to get benefit details)   PT Start Time 1017   PT Stop Time 1104   PT Time Calculation (min) 47 min   Activity Tolerance No increased pain;Patient limited by fatigue   Behavior During Therapy [facility] for tasks assessed/performed      Past Medical History:  Diagnosis Date  . Anxiety   . Asthma   . Depression   . Diabetes mellitus without complication (HCC)   . Hyperglycemia   . OSA (obstructive sleep apnea)   . TIA (transient ischemic attack) 07/20/2015    Past Surgical History:  Procedure Laterality Date  . TEE WITHOUT CARDIOVERSION N/A 07/28/2015   Procedure: TRANSESOPHAGEAL ECHOCARDIOGRAM (TEE);  Surgeon: Pricilla Riffle, MD;  Location: [facility] ENDOSCOPY;  Service: Cardiovascular;  Laterality: N/A;  WITH BUBBLE  . TONSILLECTOMY      There were no vitals filed for this visit.      Subjective Assessment - 12/02/15 1024    Subjective No new complaints, no falls.  Reports stiffness in back but no pain.     Patient is accompained by: Family member   Limitations House hold activities;Walking   How long can you stand comfortably? about 4 mins   How long can you walk comfortably? varies, up to 5 mins (states he could probably walk around building.   Patient Stated Goals "I want to be able to walk without the walker."    Currently in Pain? No/denies                         [facility] Adult PT Treatment/Exercise - 12/02/15 0001      Ambulation/Gait   Ambulation/Gait Yes   Ambulation/Gait Assistance 5: Supervision;4: Min guard   Ambulation/Gait Assistance Details Addressed gait with LRAD during session.  Pt voices that he ordered [facility] however was too short, therefore is returning and to have a LBQC delivered next week.  Continue to educate that he will likely progress quickly with devices as he practices at home, but they still wish to get [facility].  Performed 8' with LBQC at S to mod I level.  Initially was very stiff and slow gait speed with choppy gait pattern.  Performed stretches (see below) and then performed another 40' with marked improvement in technqiue and fluidity.  Then attempted to use SPC, however pt uncomfortable with this device and unable to pick up LEs fully during gait (despite walking short distance into clinic while holding RW up), therefore had pt ambulate with quad tip cane x 115'.  Performed at S to min/guard level with cues for posture and sequencing, but feel that he will progress nicely to this with practice and building up endurance.   Note that HR did elevate to 142 following gait when stiff, but was better following stretches (up to 130 for subsequent gait trials).     Ambulation Distance (Feet) 115 Feet  x 3 reps   Assistive device Large base quad cane;Straight cane  quad tip cane  Gait Pattern Step-through pattern;Decreased stride length;Narrow base of support;Trunk flexed;Poor foot clearance - left;Poor foot clearance - right   Ambulation Surface Level;Indoor     Self-Care   Self-Care Other Self-Care Comments   Other Self-Care Comments  Continue to discuss where he could perform exercises and stretches at home.  Pt and mother feel that he could perform in his room behind a high backed chair.  Will attempt once home.  Also continue to educate on AD progression.      Neuro Re-ed    Neuro Re-ed Details  Corner balance; feet together (not able to get fully together due to body habitus) EC x 2 reps of 30 secs progressing to [facility] with head turns  up/down x 10 reps and side to side x 10 reps.  Added these to HEP, see pt instruction.       Exercises   Exercises Other Exercises   Other Exercises  Performed walking on toes x 2 reps along counter top, however this was too difficult, therefore transitioned to standing heel raises x 10 reps with encouragement to perform at home.  Seated low back stretch while pushing physioball forward with 20 sec hold x 4 reps with cues for straight back, bending at waist only.                  PT Education - 12/02/15 1024    Education provided Yes   Education Details see Self care section in note   Person(s) Educated Patient;Parent(s)   Methods Explanation   Comprehension Verbalized understanding          PT Short Term Goals - 11/14/15 1008      PT SHORT TERM GOAL #1   Title Pt will initiate HEP in order to indicate improved functional mobility.  (Target Date: 12/05/15)   Time 3   Period Weeks   Status New     PT SHORT TERM GOAL #2   Title Pt will ambulate x 150' with SPC over indoor surfaces at S level in order to indicate improved independence in home.    Time 3   Period Weeks   Status New     PT SHORT TERM GOAL #3   Title Will assess 6MWT w/ LRAD and improve distance by 6875' in order to indicate improved functional endurance.    Time 3   Period Weeks   Status New     PT SHORT TERM GOAL #4   Title Pt will report no more than 4/10 pain in low back to indicate pain is decreased limiting factor in mobility.     Time 3   Period Weeks   Status New           PT Long Term Goals - 11/14/15 1011      PT LONG TERM GOAL #1   Title Pt will be independent with HEP in order to indicate improved functional mobility.  (Target Date: 12/26/15)   Time 6   Period Weeks   Status New     PT LONG TERM GOAL #2   Title Pt will ambulate with gait speed of 2.62 ft/sec in order to indicate safe community ambulator.     Time 6   Period Weeks   Status New     PT LONG TERM GOAL #3    Title Pt will perform 8/10 sit<>stand without UE support in order to indicate improved functional strength.     Time 6   Period Weeks   Status New  PT LONG TERM GOAL #4   Title Pt will ambulate 1000' w/ SPC at mod I level over paved unlevel outdoor surfaces in order to indicate improved functional mobility.    Time 6   Period Weeks   Status New     PT LONG TERM GOAL #5   Title Pt will ambulate 300' without AD over indoor surfaces at mod I level in order to indicate increased independence at home.     Time 6   Period Weeks   Status New     Additional Long Term Goals   Additional Long Term Goals Yes     PT LONG TERM GOAL #6   Title Pt will verbalize plans to return to community fitness following PT in order to maintain gains made in therapy.    Time 6   Period Weeks   Status New     PT LONG TERM GOAL #7   Title Pt will report no more than 3/10 pain in low back in order to indicate that pain is decreased limiting factor in mobility.    Time 6   Period Weeks   Status New               Plan - 12/02/15 1159    Clinical Impression Statement Skilled session focused on gait with decreasing UE support, standing and seated therex to improve strength and flexibility, and education/problem solving ways to perform HEP at home while standing.  Initiated balance exercises to HEP.    Rehab Potential Good   Clinical Impairments Affecting Rehab Potential compliance and co-morbidities   PT Frequency 1x / week   PT Duration 6 weeks   PT Treatment/Interventions ADLs/Self Care Home Management;Moist Heat;DME Instruction;Gait training;Stair training;Functional mobility training;Therapeutic activities;Therapeutic exercise;Balance training;Neuromuscular re-education;Patient/family education;Energy conservation;Vestibular   PT Next Visit Plan STGs!! check HR. continue with  bil LE strengthening/balance; gait training LRAD.   Consulted and Agree with Plan of Care Patient;Family  member/caregiver   Family Member Consulted Mother      Patient will benefit from skilled therapeutic intervention in order to improve the following deficits and impairments:  Decreased activity tolerance, Decreased balance, Decreased endurance, Decreased knowledge of precautions, Decreased knowledge of use of DME, Decreased mobility, Decreased safety awareness, Decreased strength, Difficulty walking, Impaired perceived functional ability, Impaired flexibility, Impaired sensation, Improper body mechanics, Postural dysfunction, Obesity, Pain  Visit Diagnosis: Unsteadiness on feet  Acute bilateral low back pain with sciatica, sciatica laterality unspecified  Other abnormalities of gait and mobility  Muscle weakness (generalized)     Problem List Patient Active Problem List   Diagnosis Date Noted  . PSVT (paroxysmal supraventricular tachycardia) (HCC) 11/10/2015  . Sinus tachycardia 11/10/2015  . Intractable low back pain 08/21/2015  . Lumbar radiculopathy 08/21/2015  . Intractable back pain 08/21/2015  . Diabetes mellitus without complication (HCC)   . OSA (obstructive sleep apnea)   . Generalized anxiety disorder 07/26/2015    Class: Chronic  . Persistent mood (affective) disorder, unspecified 07/26/2015    Class: Chronic  . TIA (transient ischemic attack) 07/20/2015  . Mood disorder (HCC) 07/04/2015  . Anxiety state 07/04/2015  . BMI 50.0-59.9, adult (HCC) 07/20/2011  . Asthma 03/11/2011    Harriet ButteEmily Laquanda Bick, PT, MPT [facility] [address] [place], [place], [address] Phone: [phone]   Fax:  [phone] 12/02/15, 12:02 PM  Name: Brandon Morales MRN: [id number] Date of Birth: 05/25/1985

## 2015-12-06 ENCOUNTER — Ambulatory Visit: Payer: BC Managed Care – PPO | Admitting: Physical Therapy

## 2015-12-16 ENCOUNTER — Ambulatory Visit: Payer: BC Managed Care – PPO | Admitting: Physical Therapy

## 2015-12-23 ENCOUNTER — Ambulatory Visit: Payer: BC Managed Care – PPO | Attending: Physical Medicine and Rehabilitation | Admitting: Rehabilitation

## 2015-12-23 ENCOUNTER — Encounter: Payer: Self-pay | Admitting: Rehabilitation

## 2015-12-23 DIAGNOSIS — M5440 Lumbago with sciatica, unspecified side: Secondary | ICD-10-CM | POA: Insufficient documentation

## 2015-12-23 DIAGNOSIS — R2689 Other abnormalities of gait and mobility: Secondary | ICD-10-CM | POA: Insufficient documentation

## 2015-12-23 DIAGNOSIS — M6281 Muscle weakness (generalized): Secondary | ICD-10-CM | POA: Diagnosis present

## 2015-12-23 DIAGNOSIS — R2681 Unsteadiness on feet: Secondary | ICD-10-CM | POA: Diagnosis present

## 2015-12-23 NOTE — Therapy (Signed)
[facility] [address] [place], [place], [address] Phone: [phone]   Fax:  [phone]  Physical Therapy Treatment and D/C Summary   Patient Details  Name: Brandon Morales MRN: [id number] Date of Birth: [birth year] Referring Provider: Ihor Austin, MD  Encounter Date: 12/23/2015      PT End of Session - 12/23/15 1031    Visit Number 5   Number of Visits 7   Date for PT Re-Evaluation 01/13/16   Authorization Type BCBS (will email Misty Stanley to get benefit details)   PT Start Time 1016  D/C visit, did not need whole time   PT Stop Time 1052   PT Time Calculation (min) 36 min   Activity Tolerance No increased pain;Patient limited by fatigue   Behavior During Therapy [facility] for tasks assessed/performed      Past Medical History:  Diagnosis Date  . Anxiety   . Asthma   . Depression   . Diabetes mellitus without complication (HCC)   . Hyperglycemia   . OSA (obstructive sleep apnea)   . TIA (transient ischemic attack) 07/20/2015    Past Surgical History:  Procedure Laterality Date  . TEE WITHOUT CARDIOVERSION N/A 07/28/2015   Procedure: TRANSESOPHAGEAL ECHOCARDIOGRAM (TEE);  Surgeon: Pricilla Riffle, MD;  Location: [facility] ENDOSCOPY;  Service: Cardiovascular;  Laterality: N/A;  WITH BUBBLE  . TONSILLECTOMY      There were no vitals filed for this visit.      Subjective Assessment - 12/23/15 1030    Subjective "I've been walking around at home without anything."    Patient is accompained by: Family member   Limitations House hold activities;Walking   How long can you stand comfortably? about 4 mins   How long can you walk comfortably? varies, up to 5 mins (states he could probably walk around building.   Patient Stated Goals "I want to be able to walk without the walker."    Currently in Pain? No/denies                         [facility] Adult PT Treatment/Exercise - 12/23/15 0001       Ambulation/Gait   Ambulation/Gait Yes   Ambulation/Gait Assistance 6: Modified independent (Device/Increase time)   Ambulation/Gait Assistance Details Pt able to ambulate over indoor surfaces without AD at mod I level.  Simulated outdoor gait today due to bad weather, however was able to perform at mod I level with use of SPC.  Feel that he will progress nicely with continued practice and encourage him to try gait slowly outdoors without AD.     Ambulation Distance (Feet) 200 Feet  500 x 2   Assistive device Straight cane;None   Gait Pattern Step-through pattern;Decreased stride length;Narrow base of support;Trunk flexed;Poor foot clearance - left;Poor foot clearance - right   Ambulation Surface Level;Unlevel;Indoor;Outdoor  simulated outdoor   Gait velocity 2.70 ft/sec without AD   Ramp 6: Modified independent (Device)  with SPC   Curb 6: Modified independent (Device/increase time)  with [facility]     Self-Care   Self-Care Other Self-Care Comments   Other Self-Care Comments  Provided education regarding aquatic therapy in [place] (when fixed) if pt still feels like he is not progressing as able due to pain and or body habitus.  PT to email scheduler regarding this.         TA:  Sit<>stand x 10 reps to assess functional strength.  Pt able to perform  at mod I level without UE support during session, meeting LTG.            PT Short Term Goals - 11/14/15 1008      PT SHORT TERM GOAL #1   Title Pt will initiate HEP in order to indicate improved functional mobility.  (Target Date: 12/05/15)   Time 3   Period Weeks   Status New     PT SHORT TERM GOAL #2   Title Pt will ambulate x 150' with SPC over indoor surfaces at S level in order to indicate improved independence in home.    Time 3   Period Weeks   Status New     PT SHORT TERM GOAL #3   Title Will assess 6MWT w/ LRAD and improve distance by 70' in order to indicate improved functional endurance.    Time 3   Period Weeks    Status New     PT SHORT TERM GOAL #4   Title Pt will report no more than 4/10 pain in low back to indicate pain is decreased limiting factor in mobility.     Time 3   Period Weeks   Status New           PT Long Term Goals - 12/23/15 1036      PT LONG TERM GOAL #1   Title Pt will be independent with HEP in order to indicate improved functional mobility.  (Target Date: 12/26/15)   Baseline met verbally 12/23/15   Time 6   Period Weeks   Status Achieved     PT LONG TERM GOAL #2   Title Pt will ambulate with gait speed of 2.62 ft/sec in order to indicate safe community ambulator.     Baseline 2.70 ft/sec without AD   Time 6   Period Weeks   Status Achieved     PT LONG TERM GOAL #3   Title Pt will perform 8/10 sit<>stand without UE support in order to indicate improved functional strength.     Baseline 10/10 without UE support 12/23/15   Time 6   Period Weeks   Status Achieved     PT LONG TERM GOAL #4   Title Pt will ambulate 1000' w/ SPC at mod I level over paved unlevel outdoor surfaces in order to indicate improved functional mobility.    Baseline met 12/23/15 over simulated outdoor surfaces due to poor weather.    Time 6   Period Weeks   Status Achieved     PT LONG TERM GOAL #5   Title Pt will ambulate 300' without AD over indoor surfaces at mod I level in order to indicate increased independence at home.     Baseline met 12/23/15   Time 6   Period Weeks   Status Achieved     PT LONG TERM GOAL #6   Title Pt will verbalize plans to return to community fitness following PT in order to maintain gains made in therapy.    Baseline reports will begin to start looking now that therapy is ending.    Time 6   Period Weeks   Status Partially Met     PT LONG TERM GOAL #7   Title Pt will report no more than 3/10 pain in low back in order to indicate that pain is decreased limiting factor in mobility.    Baseline 12/23/15   Time 6   Period Weeks   Status Achieved  Plan - 12/23/15 1031    Rehab Potential Good   Clinical Impairments Affecting Rehab Potential compliance and co-morbidities   PT Frequency 1x / week   PT Duration 6 weeks   PT Treatment/Interventions ADLs/Self Care Home Management;Moist Heat;DME Instruction;Gait training;Stair training;Functional mobility training;Therapeutic activities;Therapeutic exercise;Balance training;Neuromuscular re-education;Patient/family education;Energy conservation;Vestibular   PT Next Visit Plan STGs!! check HR. continue with  bil LE strengthening/balance; gait training LRAD.   Consulted and Agree with Plan of Care Patient;Family member/caregiver   Family Member Consulted Mother      Patient will benefit from skilled therapeutic intervention in order to improve the following deficits and impairments:  Decreased activity tolerance, Decreased balance, Decreased endurance, Decreased knowledge of precautions, Decreased knowledge of use of DME, Decreased mobility, Decreased safety awareness, Decreased strength, Difficulty walking, Impaired perceived functional ability, Impaired flexibility, Impaired sensation, Improper body mechanics, Postural dysfunction, Obesity, Pain  Visit Diagnosis: Unsteadiness on feet  Acute bilateral low back pain with sciatica, sciatica laterality unspecified  Other abnormalities of gait and mobility  Muscle weakness (generalized)     PHYSICAL THERAPY DISCHARGE SUMMARY  Visits from Start of Care: 5  Current functional level related to goals / functional outcomes: See LTGs above   Remaining deficits: Continues to have high level balance deficits, but is doing great job with HEP and self progressing at home.    Education / Equipment: HEP  Plan: Patient agrees to discharge.  Patient goals were met. Patient is being discharged due to meeting the stated rehab goals.  ?????         Problem List Patient Active Problem List   Diagnosis Date Noted  .  PSVT (paroxysmal supraventricular tachycardia) ([place]) 11/10/2015  . Sinus tachycardia 11/10/2015  . Intractable low back pain 08/21/2015  . Lumbar radiculopathy 08/21/2015  . Intractable back pain 08/21/2015  . Diabetes mellitus without complication ([place])   . OSA (obstructive sleep apnea)   . Generalized anxiety disorder 07/26/2015    Class: Chronic  . Persistent mood (affective) disorder, unspecified 07/26/2015    Class: Chronic  . TIA (transient ischemic attack) 07/20/2015  . Mood disorder ([place]) 07/04/2015  . Anxiety state 07/04/2015  . BMI 50.0-59.9, adult ([place]) 07/20/2011  . Asthma 03/11/2011   Cameron Sprang, PT, MPT [facility] [address] [place], [place], [address] Phone: [phone]   Fax:  [phone] 12/23/15, 2:08 PM  Name: Brandon Morales MRN: [id number] Date of Birth: 1986-01-05

## 2015-12-30 ENCOUNTER — Ambulatory Visit: Payer: Self-pay | Admitting: Rehabilitation

## 2016-12-13 ENCOUNTER — Other Ambulatory Visit: Payer: Self-pay | Admitting: Cardiology

## 2016-12-14 NOTE — Telephone Encounter (Signed)
New message     Patient mother calling to request new prescription for  metoprolol succinate (TOPROL-XL) 50 MG 24 hr tablet(Expired). States they are not going to make a follow up appointment because patient has no insurance.  Please call.  Pt c/o medication issue:  1. Name of Medication: metoprolol succinate (TOPROL-XL) 50 MG 24 hr tablet(Expired)  2. How are you currently taking this medication (dosage and times per day)? As prescribed  3. Are you having a reaction (difficulty breathing--STAT)? NO  4. What is your medication issue? No medication, no refill, expired script.

## 2017-05-31 ENCOUNTER — Encounter: Payer: Self-pay | Admitting: Family Medicine

## 2017-06-05 ENCOUNTER — Encounter: Payer: Self-pay | Admitting: Family Medicine

## 2017-07-20 IMAGING — DX DG CHEST 2V
2 series · 2 of 2 positions shown · non-contrast
Comparison: 12/25/2014

CLINICAL DATA: Chest pain

EXAM:
CHEST  2 VIEW

[chest pa]
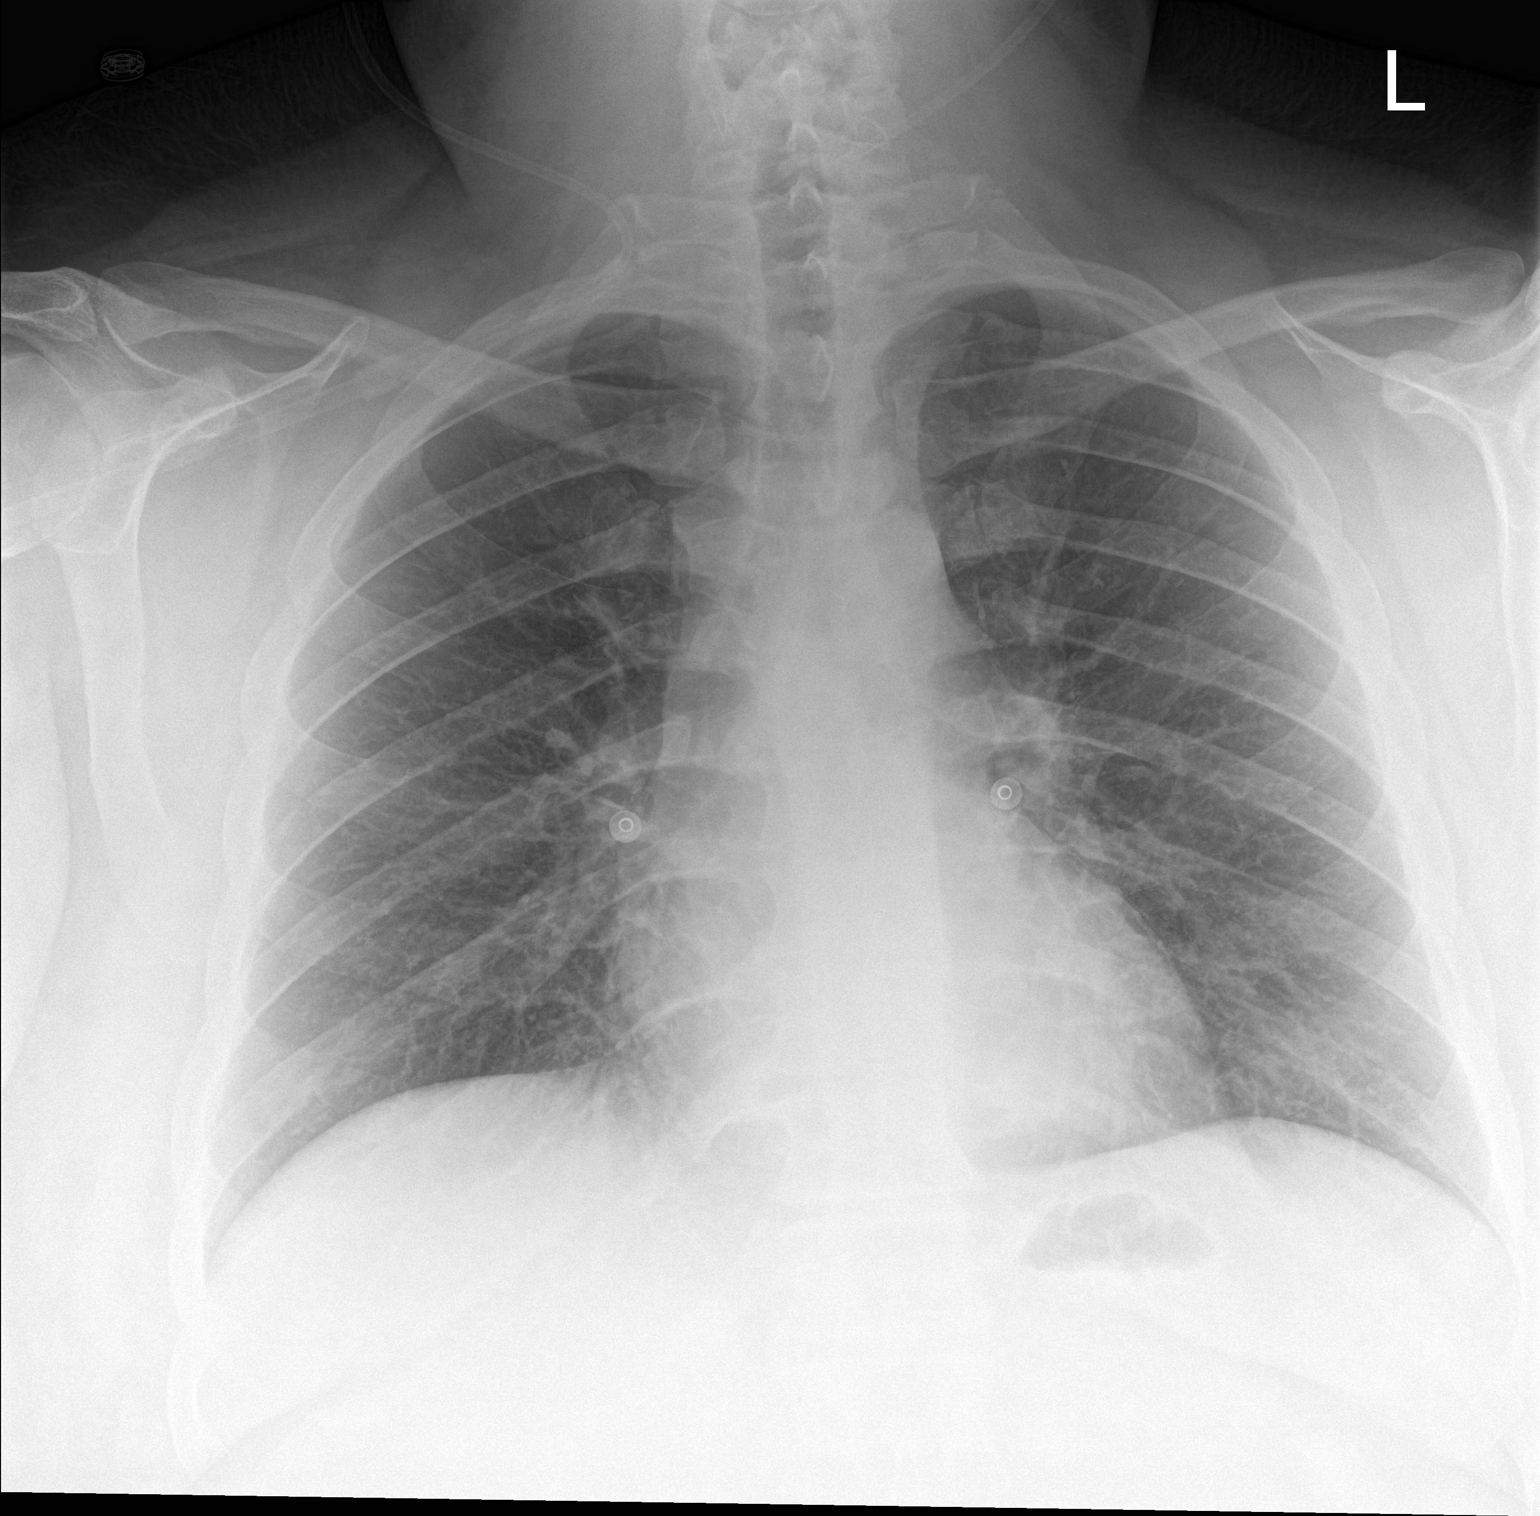

[chest lat]
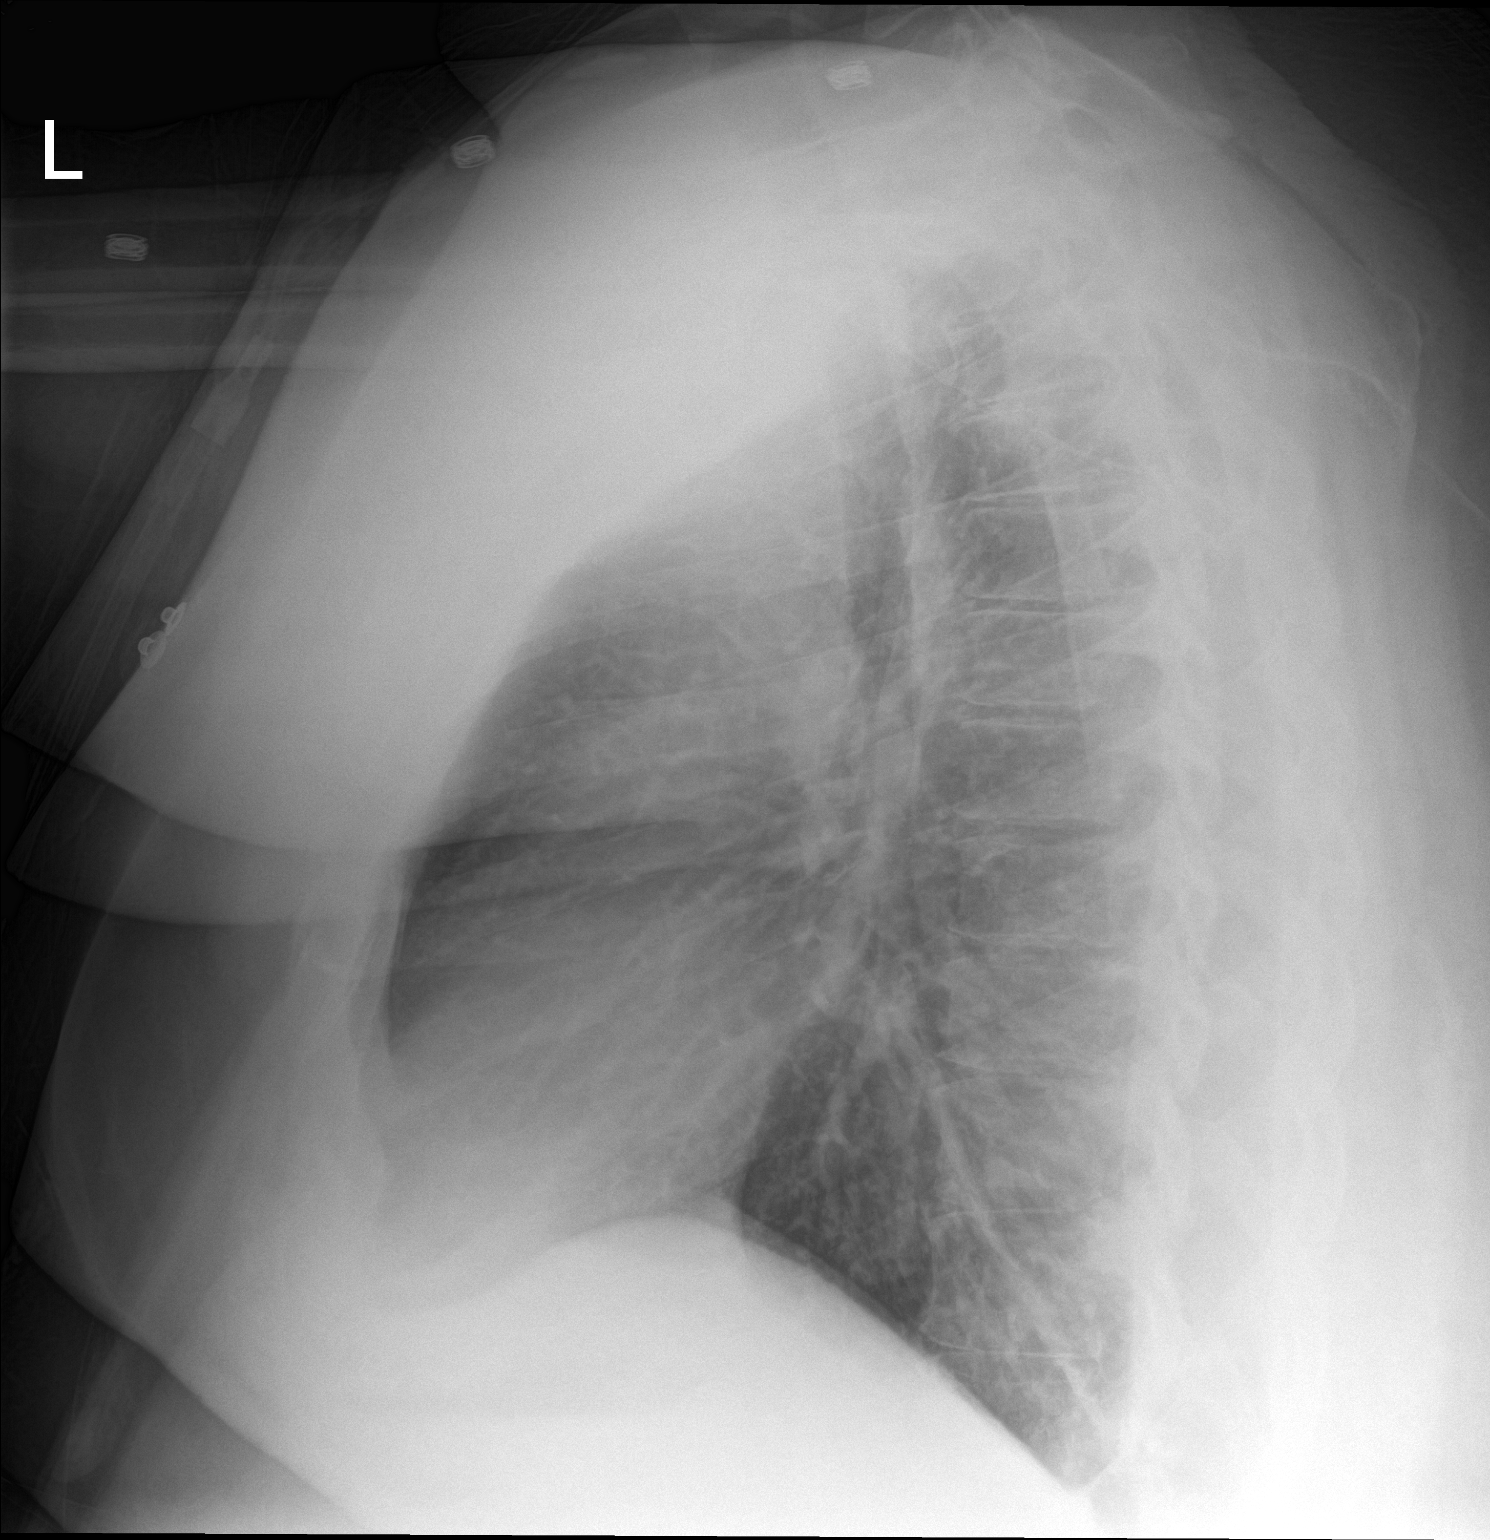

[2 of 2 positions shown; findings below may reference images not displayed]

FINDINGS: The heart size and mediastinal contours are within normal limits.
Both lungs are clear. The visualized skeletal structures are
unremarkable.
IMPRESSION: No active cardiopulmonary disease.

## 2017-10-04 IMAGING — DX DG LUMBAR SPINE COMPLETE 4+V
6 series · 6 of 6 positions shown · non-contrast
Comparison: None.

CLINICAL DATA: Recent fall following syncopal episode with low back
pain, initial encounter

EXAM:
LUMBAR SPINE - COMPLETE 4+ VIEW

[l-spine ap]
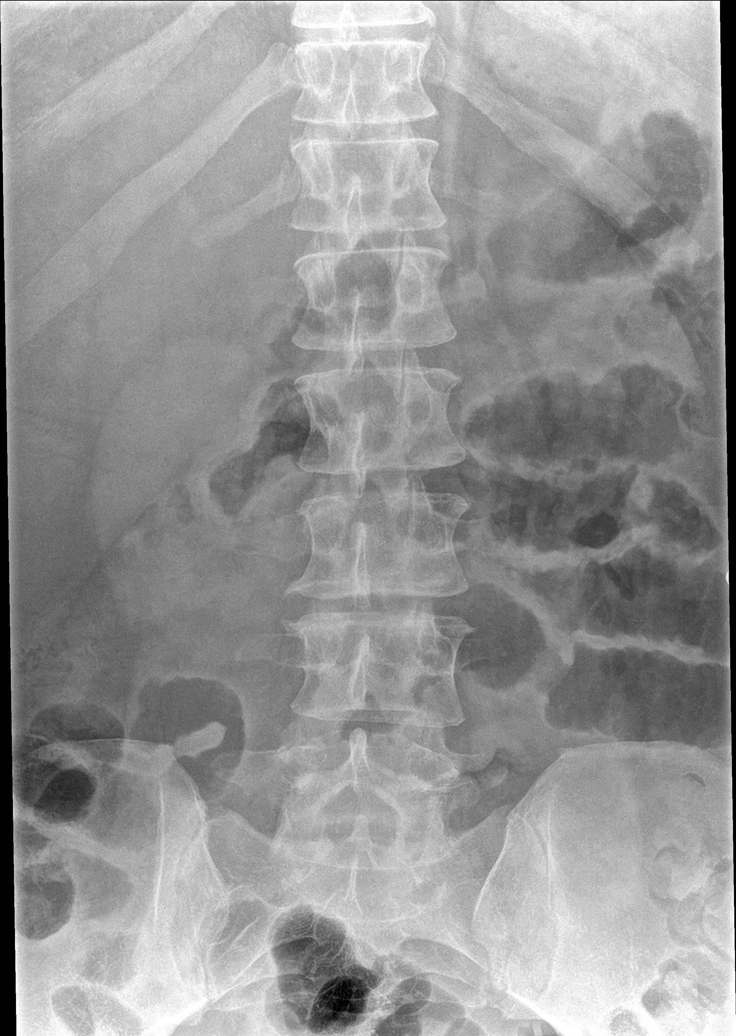

[l-spine obl (1 of 2)]
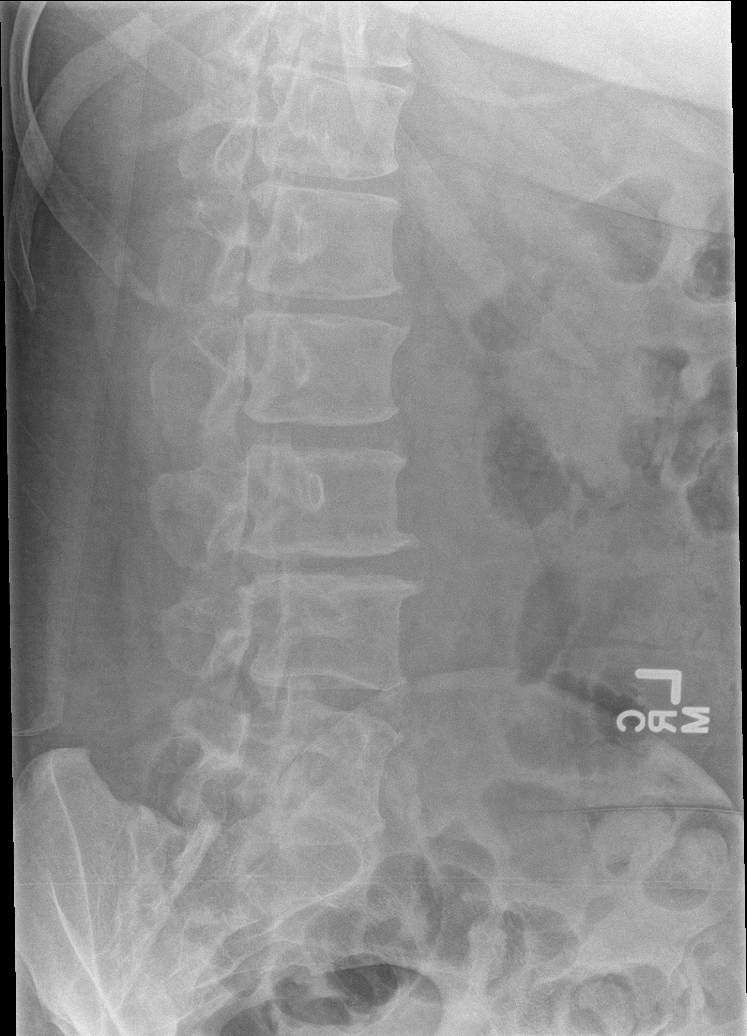

[l-spine obl (2 of 2)]
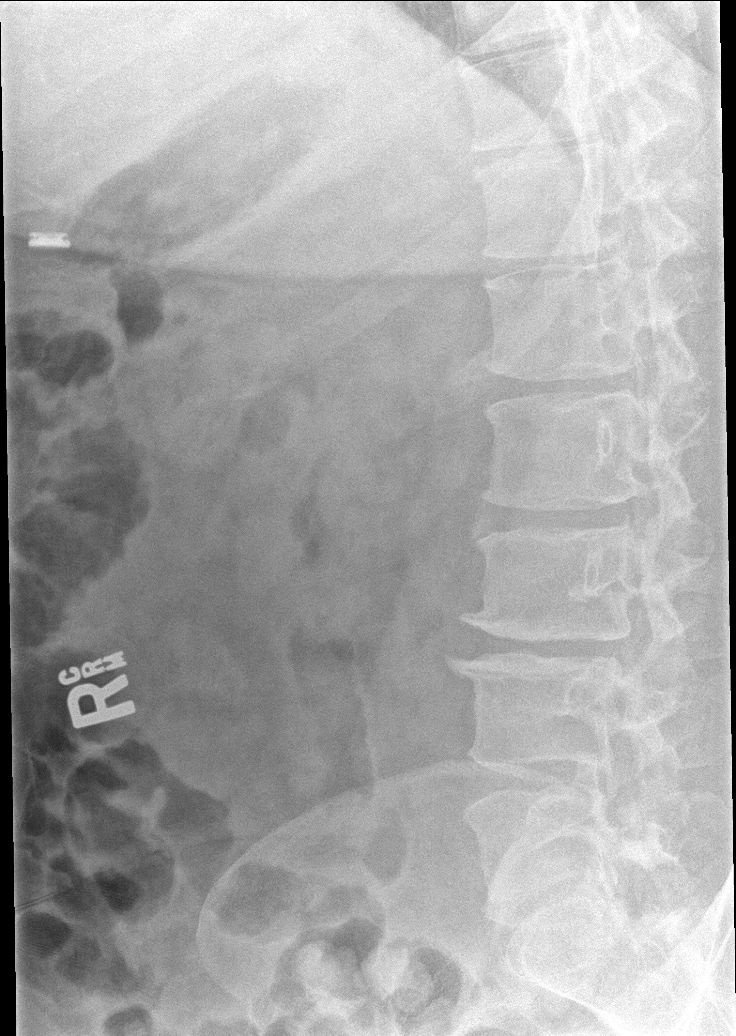

[l-spine lat (1 of 2)]
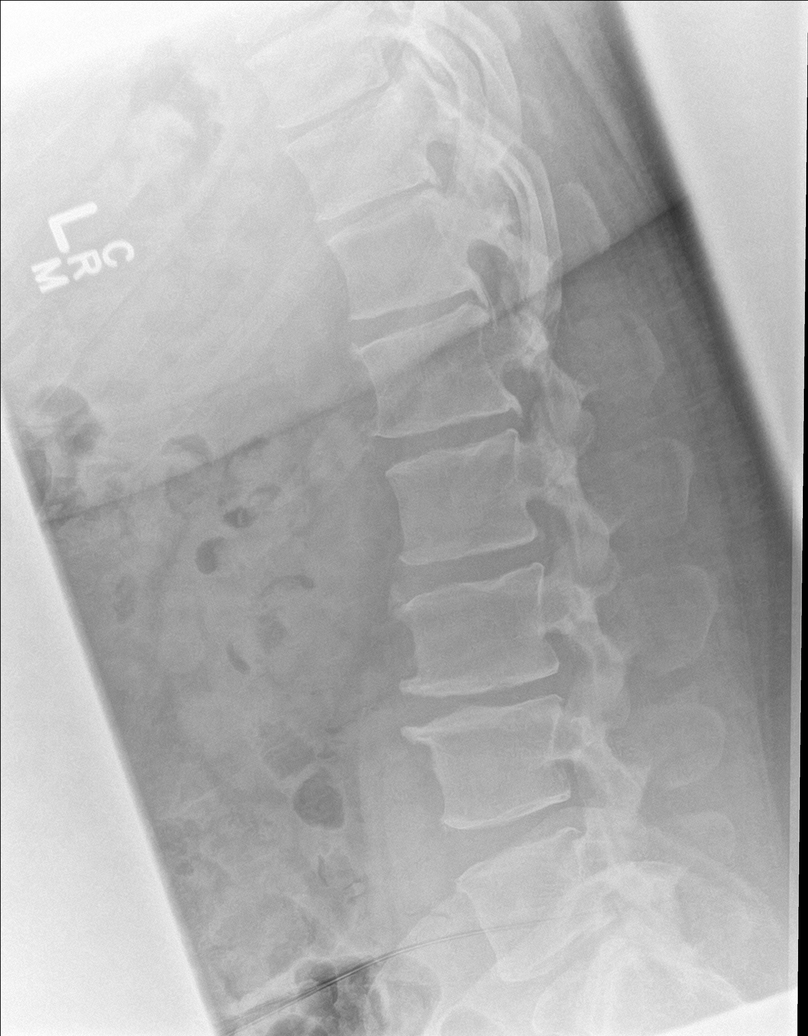

[l-spine spot]
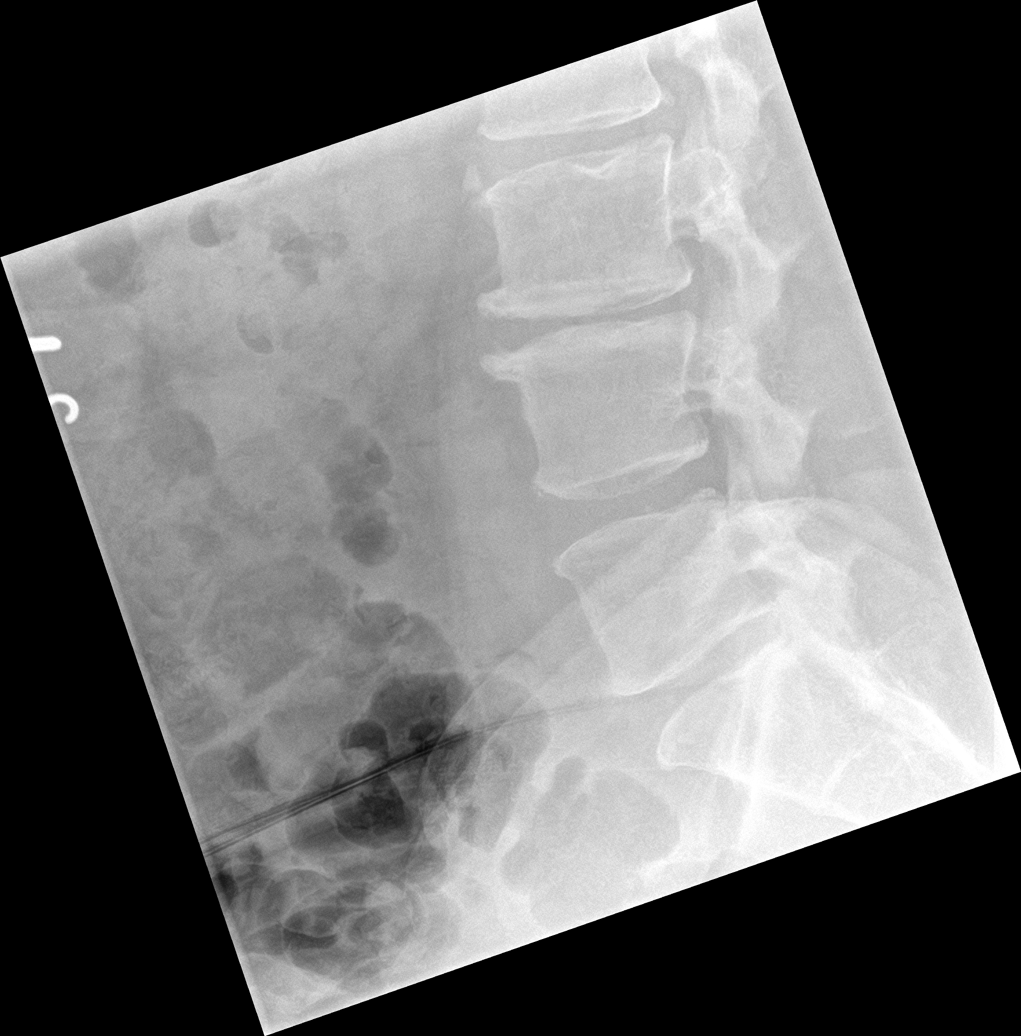

[l-spine lat (2 of 2)]
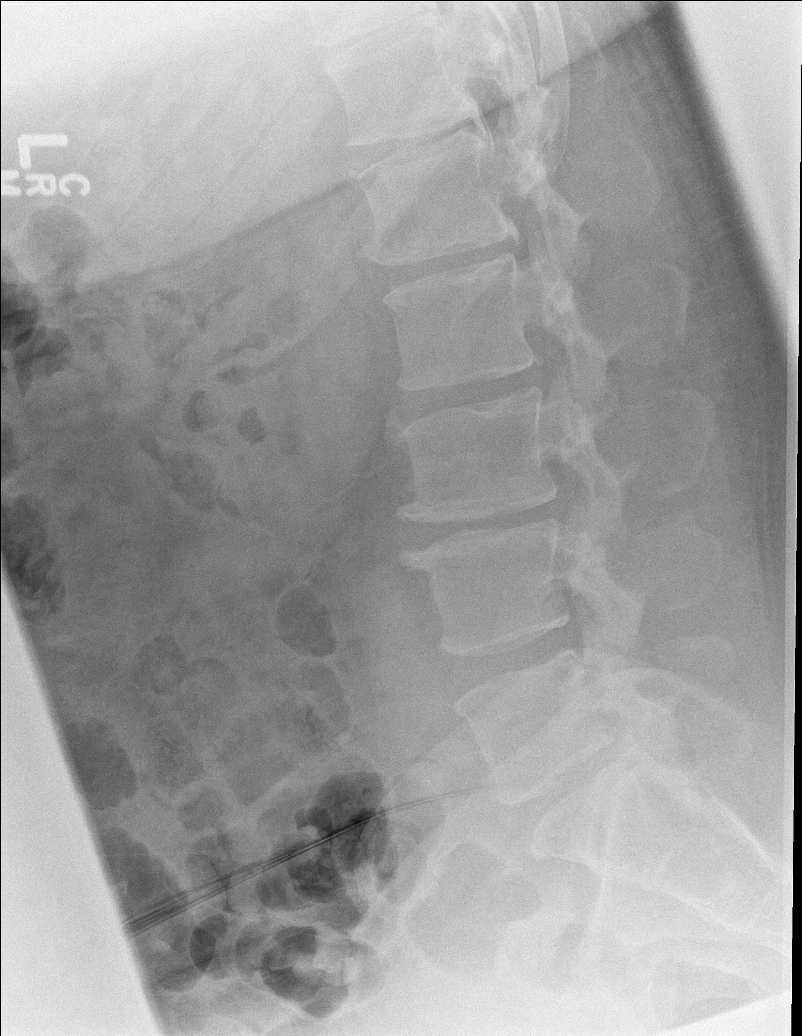

[6 of 6 positions shown; findings below may reference images not displayed]

FINDINGS: Osteophytic changes are noted at multiple levels. No compression
deformity is seen. Mild disc space narrowing is noted throughout the
lumbar spine. No anterolisthesis is noted. No soft tissue
abnormality is seen.
IMPRESSION: Degenerative change without acute abnormality.

## 2018-07-16 ENCOUNTER — Telehealth: Payer: Self-pay | Admitting: *Deleted

## 2018-07-16 NOTE — Telephone Encounter (Signed)
Brandon Morales, refused appointment stated he has no healthcare Insurance.

## 2019-03-09 ENCOUNTER — Encounter: Payer: Self-pay | Admitting: Physician Assistant

## 2019-03-09 ENCOUNTER — Other Ambulatory Visit: Payer: Self-pay

## 2019-03-09 ENCOUNTER — Ambulatory Visit (INDEPENDENT_AMBULATORY_CARE_PROVIDER_SITE_OTHER): Payer: Self-pay | Admitting: Physician Assistant

## 2019-03-09 ENCOUNTER — Encounter (INDEPENDENT_AMBULATORY_CARE_PROVIDER_SITE_OTHER): Payer: Self-pay

## 2019-03-09 VITALS — BP 150/98 | HR 121 | Ht 75.0 in | Wt 315.0 lb

## 2019-03-09 DIAGNOSIS — F603 Borderline personality disorder: Secondary | ICD-10-CM

## 2019-03-09 DIAGNOSIS — F4310 Post-traumatic stress disorder, unspecified: Secondary | ICD-10-CM

## 2019-03-09 DIAGNOSIS — F332 Major depressive disorder, recurrent severe without psychotic features: Secondary | ICD-10-CM
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
## 2022-07-24 ENCOUNTER — Ambulatory Visit (INDEPENDENT_AMBULATORY_CARE_PROVIDER_SITE_OTHER): Payer: Medicaid Other | Admitting: Neurology

## 2022-07-24 ENCOUNTER — Encounter (INDEPENDENT_AMBULATORY_CARE_PROVIDER_SITE_OTHER): Payer: Self-pay | Admitting: Neurology

## 2022-07-24 VITALS — BP 115/81 | HR 95 | Temp 98.2°F | Resp 18 | Ht 75.2 in | Wt 293.8 lb

## 2022-07-24 DIAGNOSIS — E119 Type 2 diabetes mellitus without complications: Secondary | ICD-10-CM

## 2022-07-24 DIAGNOSIS — E559 Vitamin D deficiency, unspecified: Secondary | ICD-10-CM

## 2022-07-24 DIAGNOSIS — E538 Deficiency of other specified B group vitamins: Secondary | ICD-10-CM

## 2022-07-24 DIAGNOSIS — M48061 Spinal stenosis, lumbar region without neurogenic claudication: Secondary | ICD-10-CM

## 2022-07-24 DIAGNOSIS — D472 Monoclonal gammopathy: Secondary | ICD-10-CM

## 2022-07-24 NOTE — Progress Notes (Signed)
Neurology History and Physical     Date: July 24, 2022     Name:David Flowers   Date of Birth: 10-15-1985   MRN: [id number] C     CC:   Chief Complaint   Patient presents with    Back Pain     Patient injured his L2 and L3 in 2017.  Patient had back surgery.        History      This is a 37 y.o. right handed male with a medical history of dmII. In 2017 ruptured his L2-L3 and needed surgery to decompress the canal. He managed to lose 300 lbs naturally but noted that he slowly was losing feeling in both his legs. He now has very poor balance.     Assessment and Plan:   Lumbar stenosis  S/p decompression L2-3  MRI lumbar spine    2. Numbness lower ext  Will need neuropathy work up and emg to differentiate between neuropathy or nerve damage secondary to stenosis.     3. DMII  Continue metformin          Medical Hx     Past Medical History:   Diagnosis Date    Asthma     Back pain     Balance problem     Cerebrovascular accident     Diabetes mellitus     Hypercholesterolemia     Hypertension     Neuropathy     Numbness     Occupational therapy and vocational rehabilitation     Other physical therapy     Pain     Pins and needles sensation     Weakness         Meds     Prior to Admission medications    Medication Sig Start Date End Date Taking? Authorizing Provider   divalproex, ER, extended release (DEPAKOTE ER) 500 MG 24 hr tablet Take 1 tablet (500 mg) by mouth nightly   Yes [provider]   Farxiga 10 MG Tab Take 1 tablet (10 mg) by mouth every morning   Yes [provider]   ibuprofen (ADVIL) 800 MG tablet Take 1 tablet (800 mg) by mouth every 8 (eight) hours as needed 05/21/22  Yes [provider]   metFORMIN (GLUCOPHAGE) 500 MG tablet Take 1 tablet (500 mg) by mouth every morning with breakfast 06/30/22  Yes [provider]   metoprolol succinate XL (TOPROL-XL) 50 MG 24 hr tablet Take 1 tablet (50 mg) by mouth daily For 90 days 07/09/22  Yes [provider]   Multiple  Vitamin (multivitamin) capsule Take 1 capsule by mouth daily   Yes [provider]   PARoxetine (PAXIL) 20 MG tablet Take 1 tablet (20 mg) by mouth nightly   Yes [provider]        Surgical Hx:   History reviewed. No pertinent surgical history.     Family Medical History:      Family History   Problem Relation Age of Onset    Diabetes Mother         Deceased    Depression Mother     Arthritis Mother     Migraines Mother     Kidney disease Mother        Social Hx     Social History     Socioeconomic History    Marital status: Single   Tobacco Use    Smoking status: Never     Passive exposure:  Never    Smokeless tobacco: Never   Vaping Use    Vaping status: Never Used   Substance and Sexual Activity    Alcohol use: Not Currently    Drug use: Never    Sexual activity: Never     Social Determinants of Health     Financial Resource Strain: Medium Risk (07/22/2022)    Overall Financial Resource Strain (CARDIA)     Difficulty of Paying Living Expenses: Somewhat hard   Food Insecurity: No Food Insecurity (07/22/2022)    Hunger Vital Sign     Worried About Running Out of Food in the Last Year: Never true     Ran Out of Food in the Last Year: Never true   Transportation Needs: No Transportation Needs (07/22/2022)    PRAPARE - Therapist, art (Medical): No     Lack of Transportation (Non-Medical): No   Physical Activity: Insufficiently Active (07/22/2022)    Exercise Vital Sign     Days of Exercise per Week: 1 day     Minutes of Exercise per Session: 10 min   Stress: Stress Concern Present (07/22/2022)    [employer] of Occupational Health - Occupational Stress Questionnaire     Feeling of Stress : To some extent   Social Connections: Moderately Integrated (07/22/2022)    Social Connection and Isolation Panel [NHANES]     Frequency of Communication with Friends and Family: More than three times a week     Frequency of Social Gatherings with Friends and Family: Patient declined      Attends Religious Services: More than 4 times per year     Active Member of [employer] or Organizations: Yes     Attends Banker Meetings: More than 4 times per year     Marital Status: Never married   Intimate Partner Violence: Not At Risk (07/22/2022)    Humiliation, Afraid, Rape, and Kick questionnaire     Fear of Current or Ex-Partner: No     Emotionally Abused: No     Physically Abused: No     Sexually Abused: No   Housing Stability: Unknown (07/22/2022)    Housing Stability Vital Sign     Unable to Pay for Housing in the Last Year: Patient declined     Number of Places Lived in the Last Year: 2     Unstable Housing in the Last Year: No       Allergies    Azithromycin, Pollen extract, and Erythromycin    Review of Systems   Review of Systems   Constitutional:  Negative for chills and fever.   HENT:  Negative for hearing loss.    Eyes:  Negative for blurred vision and double vision.   Respiratory:  Negative for cough and hemoptysis.    Cardiovascular:  Negative for chest pain and palpitations.   Gastrointestinal:  Negative for heartburn and nausea.   Genitourinary:  Negative for dysuria and urgency.   Musculoskeletal:  Positive for back pain. Negative for myalgias and neck pain.   Skin:  Negative for itching and rash.   Neurological:  Positive for tingling. Negative for dizziness and headaches.   Endo/Heme/Allergies:  Negative for environmental allergies. Does not bruise/bleed easily.   Psychiatric/Behavioral:  Negative for depression and suicidal ideas.         Physical Exam:     Vitals:    07/24/22 1120   BP: 115/81   Pulse: 95   Resp: 18  Temp: 98.2 F (36.8 C)   SpO2: 98%      Physical Exam  Vitals and nursing note reviewed.   Constitutional:       Appearance: Normal appearance.   HENT:      Head: Normocephalic and atraumatic.   Eyes:      Extraocular Movements: Extraocular movements intact.      Conjunctiva/sclera: Conjunctivae normal.      Pupils: Pupils are equal, round, and reactive to light.    Cardiovascular:      Rate and Rhythm: Normal rate and regular rhythm.      Heart sounds: Normal heart sounds.   Pulmonary:      Effort: Pulmonary effort is normal.   Abdominal:      General: Bowel sounds are normal.      Palpations: Abdomen is soft.   Musculoskeletal:         General: Normal range of motion.   Skin:     General: Skin is warm.      Findings: No rash.   Neurological:      Mental Status: He is alert and oriented to person, place, and time.      Cranial Nerves: No cranial nerve deficit, dysarthria or facial asymmetry.      Sensory: Sensory deficit (distal symmetric sensory loss) present.      Motor: No weakness, tremor, abnormal muscle tone or pronator drift.      Coordination: Coordination normal.      Gait: Gait abnormal (wide based cane dependant).      Deep Tendon Reflexes:      Reflex Scores:       Tricep reflexes are 2+ on the right side and 2+ on the left side.       Bicep reflexes are 2+ on the right side and 2+ on the left side.       Brachioradialis reflexes are 1+ on the right side.       Patellar reflexes are 0 on the right side and 0 on the left side.       Achilles reflexes are 0 on the right side and 0 on the left side.          60 minutes spent discussing condition, management and treatment.

## 2022-07-27 ENCOUNTER — Telehealth (INDEPENDENT_AMBULATORY_CARE_PROVIDER_SITE_OTHER): Payer: Self-pay

## 2022-07-27 ENCOUNTER — Encounter (INDEPENDENT_AMBULATORY_CARE_PROVIDER_SITE_OTHER): Payer: Self-pay

## 2022-07-27 NOTE — Telephone Encounter (Signed)
On 07/24/2022, patient left his folder of outside records for [place] to scan into his chart.  Records have been scanned into patients chart.  I spoke to patient on [phone], informing him that his folder of records are ready for pick up.  Office hours were relayed, Monday-Friday 8:00-4:00.  Patient verbalized an understanding and stated that he'll be here on Monday, 07/30/2022.

## 2022-08-10 ENCOUNTER — Other Ambulatory Visit (HOSPITAL_BASED_OUTPATIENT_CLINIC_OR_DEPARTMENT_OTHER): Payer: Self-pay | Admitting: Neurology

## 2022-08-10 DIAGNOSIS — M5459 Other low back pain: Secondary | ICD-10-CM

## 2022-08-16 ENCOUNTER — Ambulatory Visit
Admission: RE | Admit: 2022-08-16 | Discharge: 2022-08-16 | Disposition: A | Discharge status: Home / Self Care | Payer: Medicaid Other | Source: Ambulatory Visit | Attending: Neurology | Admitting: Neurology

## 2022-08-16 ENCOUNTER — Other Ambulatory Visit: Payer: Self-pay | Admitting: Neurology

## 2022-08-16 ENCOUNTER — Other Ambulatory Visit (HOSPITAL_BASED_OUTPATIENT_CLINIC_OR_DEPARTMENT_OTHER): Payer: Self-pay | Admitting: Neurology

## 2022-08-16 DIAGNOSIS — M5459 Other low back pain: Secondary | ICD-10-CM | POA: Insufficient documentation

## 2022-08-16 DIAGNOSIS — M48061 Spinal stenosis, lumbar region without neurogenic claudication: Secondary | ICD-10-CM

## 2022-08-23 ENCOUNTER — Other Ambulatory Visit (HOSPITAL_BASED_OUTPATIENT_CLINIC_OR_DEPARTMENT_OTHER): Payer: Self-pay

## 2022-08-23 DIAGNOSIS — M48061 Spinal stenosis, lumbar region without neurogenic claudication: Secondary | ICD-10-CM

## 2022-09-21 ENCOUNTER — Encounter (INDEPENDENT_AMBULATORY_CARE_PROVIDER_SITE_OTHER): Payer: Self-pay

## 2022-09-21 ENCOUNTER — Ambulatory Visit (INDEPENDENT_AMBULATORY_CARE_PROVIDER_SITE_OTHER): Payer: Medicaid Other | Admitting: Neurology

## 2022-09-21 DIAGNOSIS — E1142 Type 2 diabetes mellitus with diabetic polyneuropathy: Secondary | ICD-10-CM

## 2022-09-21 DIAGNOSIS — E559 Vitamin D deficiency, unspecified: Secondary | ICD-10-CM

## 2022-09-21 MED ORDER — CHOLECALCIFEROL 1.25 MG (50000 UT) PO CAPS
50000.0000 [IU] | ORAL_CAPSULE | ORAL | 1 refills | Status: AC
Start: 2022-09-21 — End: ?

## 2022-11-05 ENCOUNTER — Encounter (INDEPENDENT_AMBULATORY_CARE_PROVIDER_SITE_OTHER): Payer: Self-pay | Admitting: Neurology

## 2022-11-05 NOTE — Procedures (Signed)
[facility]  [address]  [place], [place] [address]  P. [phone]   F. [phone]            Full Name: David Flowers Gender: Male  Patient ID: [id number] Date of Birth: 04-05-1985      Visit Date: 09/21/2022 9:45 AM  Age: 37 Years      Sensory NCS      Nerve / Sites Rec. Site Onset Lat Peak Lat Ref. NP Amp Ref. PP Amp Segments Distance Onset Vel Ref. Temp.     ms ms ms V V V  cm m/s m/s C   R Superficial peroneal      Lat leg Ankle NR NR <=3.30 NR >=13.9 NR Lat leg - Ankle 12 NR  22.7   L Superficial peroneal      Lat leg Ankle NR NR <=3.30 NR >=13.9 NR Lat leg - Ankle 12 NR  22.7   R Sural      Calf Lat Mall NR NR <=4.40 NR >=5.0 NR Calf - Lat Mall 14 NR >=42.0 22.1   L Sural      Calf Lat Mall NR NR <=4.40 NR >=5.0 NR Calf - Lat Mall 14 NR >=42.0 23       Motor NCS      Nerve / Sites Rec. Site Lat Ref. Amp Ref. Seq Amp Ref. Segments Dist. Vel Temp     ms ms mV mV % %  cm m/s C   L Peroneal - EDB      Ankle EDB NR <=6.50 NR >=2.0 NR  Ankle - EDB 9 NR 23.2   R Tibial - AH      Ankle AH NR <=5.80 NR >=4.0 NR  Ankle - AH 11 NR 22   L Tibial - AH      Ankle AH NR <=5.80 NR >=4.0 NR  Ankle - AH 11 NR 22.4   R Peroneal - Tib Ant      Fib Head Tib Ant 4.06 <=6.80 2.2 >=4.9 100  Fib Head - Tib Ant   22.1      Knee Tib Ant 6.29  2.4  108 <=115 Knee - Fib Head 10 45 22.1   L Peroneal - Tib Ant      Fib Head Tib Ant 3.79 <=6.80 3.1 >=4.9 100  Fib Head - Tib Ant   22.7      Knee Tib Ant 5.77  3.8  122 <=115 Knee - Fib Head 10 51 22.7       Needle EMG              EMG Summary Table     Spontaneous MUAP Recruitment   Muscle Nerve Roots IA Fib PSW Fasc H.F. Amp Dur. PPP Pattern   R. Tibialis anterior Deep peroneal (Fibular) L4-L5 N None None None None N N N N   R. Gastrocnemius (Medial head) Tibial S1-S2 N None None None None N N N N                             Summary    Sensory  Right superficial peroneal was nonresponsive  Left superficial peroneal was  nonresponsive  Right sural nonresponsive  Left sural was nonresponsive    Motor  Right peroneal was nonresponsive  Left peroneal nonresponsive  Right tibial was nonresponsive  Right peroneal measured at the tibialis anterior showed low  amplitude.  Left peroneal nerve assessed at the tibialis anterior showed low amplitude.       EMG  Insertional needle electromyography was normal for the right tibialis anterior and right gastrocnemius.    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  Clinical correlation is advised.      ------------------------------   Electronically Signed: Oliver Barre, MD

## 2022-11-15 ENCOUNTER — Encounter (INDEPENDENT_AMBULATORY_CARE_PROVIDER_SITE_OTHER): Payer: Self-pay

## 2023-01-21 ENCOUNTER — Telehealth (INDEPENDENT_AMBULATORY_CARE_PROVIDER_SITE_OTHER): Payer: Medicaid Other | Admitting: Neurology

## 2023-01-21 DIAGNOSIS — E1142 Type 2 diabetes mellitus with diabetic polyneuropathy: Secondary | ICD-10-CM

## 2023-01-21 DIAGNOSIS — M48061 Spinal stenosis, lumbar region without neurogenic claudication: Secondary | ICD-10-CM

## 2023-01-21 NOTE — Progress Notes (Signed)
 Neurology Follow-up     Date: January 21, 2023     Name:David Flowers   Date of Birth: 09-25-1985   MRN: [id number] C         History     Since he was last seen things have been stable.  He has not had a chance to get the labs done.  We discussed the MRI results as well as the EMG.  He has no further questions today.      Assessment and Plan:   1. 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 Spinal stenosis of lumbar region without neurogenic claudication  Has moderate to severe central stenosis at L4 and 5 but no significant back pain.            Medical Hx     Past Medical History:   Diagnosis Date    Asthma     Back pain     Balance problem     Cerebrovascular accident     Diabetes mellitus     Hypercholesterolemia     Hypertension     Neuropathy     Numbness     Occupational therapy and vocational rehabilitation     Other physical therapy     Pain     Pins and needles sensation     Weakness         Meds     Current Outpatient Medications   Medication Instructions    Cholecalciferol  50,000 Units, Oral, weekly    divalproex (ER) extended release (DEPAKOTE ER) 500 mg, Oral, At bedtime    Farxiga 10 MG Tab 1 tablet, Oral, Every morning    ibuprofen (ADVIL) 800 mg, Oral, Every 8 hours PRN    metFORMIN (GLUCOPHAGE) 500 mg, Oral, Every morning with breakfast    metoprolol succinate XL (TOPROL-XL) 50 mg, Oral, Daily, For 90 days    Multiple Vitamin (multivitamin) capsule 1 capsule, Oral, Daily    PARoxetine (PAXIL) 20 mg, Oral, At bedtime          Social Hx   Social History[1]    Allergies    Azithromycin, Pollen extract, and Erythromycin    Review of Systems   Review of Systems   Constitutional:  Negative for chills and fever.   HENT:  Negative for hearing loss.    Eyes:  Negative for blurred vision and double vision.   Respiratory:  Negative for cough and hemoptysis.    Cardiovascular:  Negative for chest pain and palpitations.    Gastrointestinal:  Negative for heartburn and nausea.   Genitourinary:  Negative for dysuria and urgency.   Musculoskeletal:  Positive for back pain. Negative for myalgias and neck pain.   Skin:  Negative for itching and rash.   Neurological:  Positive for tingling. Negative for dizziness and headaches.   Endo/Heme/Allergies:  Negative for environmental allergies. Does not bruise/bleed easily.   Psychiatric/Behavioral:  Negative for depression and suicidal ideas.         Physical Exam:   There were no vitals filed for this visit.   Physical Exam  Constitutional:       Appearance: Normal appearance. He is normal weight.   HENT:      Head: Normocephalic and atraumatic.      Nose: Nose normal.   Eyes:      Extraocular Movements: Extraocular movements intact.      Pupils: Pupils are equal, round, and reactive to light.  Musculoskeletal:      Cervical back: Normal range of motion.   Neurological:      Mental Status: He is alert and oriented to person, place, and time. Mental status is at baseline.      Cranial Nerves: No cranial nerve deficit, dysarthria or facial asymmetry.      Motor: No weakness.      Gait: Gait normal.   Psychiatric:         Mood and Affect: Mood normal.         Thought Content: Thought content normal.           Labs:   No results found for: METABOLICSCR, CBC, HGBA1C, LIPID, TSH, VITD, B12          [1]   Social History  Socioeconomic History    Marital status: Single   Tobacco Use    Smoking status: Never     Passive exposure: Never    Smokeless tobacco: Never   Vaping Use    Vaping status: Never Used   Substance and Sexual Activity    Alcohol use: Not Currently    Drug use: Never    Sexual activity: Never     Social Drivers of Health     Financial Resource Strain: Medium Risk (07/22/2022)    Overall Financial Resource Strain (CARDIA)     Difficulty of Paying Living Expenses: Somewhat hard   Food Insecurity: No Food Insecurity (07/22/2022)    Hunger Vital Sign     Worried About Running  Out of Food in the Last Year: Never true     Ran Out of Food in the Last Year: Never true   Transportation Needs: No Transportation Needs (07/22/2022)    PRAPARE - Therapist, art (Medical): No     Lack of Transportation (Non-Medical): No   Physical Activity: Insufficiently Active (07/22/2022)    Exercise Vital Sign     Days of Exercise per Week: 1 day     Minutes of Exercise per Session: 10 min   Stress: Stress Concern Present (07/22/2022)    [employer] of Occupational Health - Occupational Stress Questionnaire     Feeling of Stress : To some extent   Social Connections: Moderately Integrated (07/22/2022)    Social Connection and Isolation Panel [NHANES]     Frequency of Communication with Friends and Family: More than three times a week     Frequency of Social Gatherings with Friends and Family: Patient declined     Attends Religious Services: More than 4 times per year     Active Member of [employer] or Organizations: Yes     Attends Banker Meetings: More than 4 times per year     Marital Status: Never married   Intimate Partner Violence: Not At Risk (07/22/2022)    Humiliation, Afraid, Rape, and Kick questionnaire     Fear of Current or Ex-Partner: No     Emotionally Abused: No     Physically Abused: No     Sexually Abused: No   Housing Stability: Unknown (07/22/2022)    Housing Stability Vital Sign     Unable to Pay for Housing in the Last Year: Patient declined     Number of Places Lived in the Last Year: 2     Unstable Housing in the Last Year: No

## 2023-01-25 ENCOUNTER — Telehealth: Payer: Self-pay | Admitting: Neurology

## 2023-01-25 NOTE — Telephone Encounter (Signed)
 Copied from CRM [id number]. Topic: Clinical Support - Speak With Nurse  >> Jan 25, 2023 11:34 AM Velma ORN wrote:  Consalvo, David Flowers called about Clinical Support - Speak With Nurse.  Additional details:    Patient is calling to request a note stating that he is disabled. He is asking for a call back when completed. He can be reached at [phone]

## 2023-04-25 ENCOUNTER — Ambulatory Visit (INDEPENDENT_AMBULATORY_CARE_PROVIDER_SITE_OTHER): Payer: Medicaid Other | Admitting: Neurology

## 2023-05-19 ENCOUNTER — Other Ambulatory Visit (INDEPENDENT_AMBULATORY_CARE_PROVIDER_SITE_OTHER): Payer: Self-pay | Admitting: Neurology

## 2023-05-19 DIAGNOSIS — E559 Vitamin D deficiency, unspecified: Secondary | ICD-10-CM

## 2023-05-22 NOTE — Telephone Encounter (Signed)
 Refused Prescriptions     Name from pharmacy: VITAMIN D3 50,000 UNIT CAPSULE         Will file in chart as: Cholecalciferol  (Vitamin D3) 1.25 MG (50000 UT) Cap    Sig: TAKE 1 CAPSULE BY MOUTH ONCE A WEEK    Disp: 4 capsule    Refills: 5    Start: 05/20/2023    Class: E-Rx    Refused by: Enzo Sherleen HERO, MD    Refusal reason: Refill not appropriate    Non-formulary        Fill requested from: [id number] - [place], [place] - [id number]

## 2023-08-14 ENCOUNTER — Encounter (INDEPENDENT_AMBULATORY_CARE_PROVIDER_SITE_OTHER): Payer: Self-pay | Admitting: Neurology

## 2023-08-28 ENCOUNTER — Encounter (INDEPENDENT_AMBULATORY_CARE_PROVIDER_SITE_OTHER): Payer: Self-pay | Admitting: Neurology

## 2023-08-28 ENCOUNTER — Ambulatory Visit (INDEPENDENT_AMBULATORY_CARE_PROVIDER_SITE_OTHER): Admitting: Neurology

## 2023-08-28 VITALS — BP 98/70 | HR 98 | Temp 97.9°F | Resp 16 | Ht 75.0 in | Wt 323.0 lb

## 2023-08-28 DIAGNOSIS — E1142 Type 2 diabetes mellitus with diabetic polyneuropathy: Secondary | ICD-10-CM

## 2023-08-28 DIAGNOSIS — M5441 Lumbago with sciatica, right side: Secondary | ICD-10-CM

## 2023-08-28 DIAGNOSIS — M48062 Spinal stenosis, lumbar region with neurogenic claudication: Secondary | ICD-10-CM

## 2023-08-28 DIAGNOSIS — G8929 Other chronic pain: Secondary | ICD-10-CM

## 2023-08-28 DIAGNOSIS — M5442 Lumbago with sciatica, left side: Secondary | ICD-10-CM

## 2023-08-28 DIAGNOSIS — F319 Bipolar disorder, unspecified: Secondary | ICD-10-CM

## 2023-08-28 NOTE — Progress Notes (Signed)
 Pt saw Dr. Enzo today and brought in outside paperwork from [facility].

## 2023-08-28 NOTE — Progress Notes (Signed)
 Neurology Follow-up     Date: August 28, 2023     Name:David Flowers   Date of Birth: 1985/08/30   MRN: [id number] C         History     Since he was last seen things have been stable.  He is able to drive after regaining some sensation in his feet. He has more strength in his legs but is still cane dependant. He is complaint with medication and treatment.       Assessment and Plan:   1. 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 Spinal stenosis of lumbar region withneurogenic claudication  Has moderate to severe central stenosis at L4 and 5   Disability papers filled     3. Depression  Continue paxil       Medical Hx     Past Medical History:   Diagnosis Date    Asthma     Back pain     Balance problem     Cerebrovascular accident (CMS/HCC)     Diabetes mellitus (CMS/HCC)     Hypercholesterolemia     Hypertension     Neuropathy     Numbness     Occupational therapy and vocational rehabilitation     Other physical therapy     Pain     Pins and needles sensation     Weakness         Meds     Current Outpatient Medications   Medication Instructions    Cholecalciferol  50,000 Units, Oral, weekly    divalproex (ER) extended release (DEPAKOTE ER) 500 mg, At bedtime    Farxiga 10 MG Tab 1 tablet, Every morning    ibuprofen (ADVIL) 800 mg, Every 8 hours PRN    metFORMIN (GLUCOPHAGE) 500 mg, Every morning with breakfast    metoprolol succinate XL (TOPROL-XL) 50 mg, Daily    Multiple Vitamin (multivitamin) capsule 1 capsule, Daily    PARoxetine (PAXIL) 20 mg, At bedtime    Trulicity 0.75 mg, weekly          Social Hx   Social History[1]    Allergies    Azithromycin, Pollen extract, and Erythromycin    Review of Systems   Review of Systems   Constitutional:  Negative for chills and fever.   HENT:  Negative for hearing loss.    Eyes:  Negative for blurred vision and double vision.   Respiratory:  Negative for cough and hemoptysis.     Cardiovascular:  Negative for chest pain and palpitations.   Gastrointestinal:  Negative for heartburn and nausea.   Genitourinary:  Negative for dysuria and urgency.   Musculoskeletal:  Positive for back pain. Negative for myalgias and neck pain.   Skin:  Negative for itching and rash.   Neurological:  Positive for tingling. Negative for dizziness and headaches.   Endo/Heme/Allergies:  Negative for environmental allergies. Does not bruise/bleed easily.   Psychiatric/Behavioral:  Negative for depression and suicidal ideas.         Physical Exam:     Vitals:    08/28/23 1026   BP: 98/70   Pulse: 98   Resp: 16   Temp: 97.9 F (36.6 C)   SpO2: 98%      Physical Exam  Constitutional:       Appearance: Normal appearance. He is normal weight.   HENT:      Head: Normocephalic and atraumatic.  Nose: Nose normal.   Eyes:      Extraocular Movements: Extraocular movements intact.      Pupils: Pupils are equal, round, and reactive to light.   Musculoskeletal:      Cervical back: Normal range of motion.   Neurological:      Mental Status: He is alert and oriented to person, place, and time. Mental status is at baseline.      Cranial Nerves: No cranial nerve deficit, dysarthria or facial asymmetry.      Motor: No weakness.      Gait: Gait abnormal (antalgic).   Psychiatric:         Mood and Affect: Mood normal.         Thought Content: Thought content normal.                 [1]   Social History  Socioeconomic History    Marital status: Single   Tobacco Use    Smoking status: Never     Passive exposure: Never    Smokeless tobacco: Never   Vaping Use    Vaping status: Never Used   Substance and Sexual Activity    Alcohol use: Not Currently    Drug use: Never    Sexual activity: Never     Social Drivers of Psychologist, prison and probation services Strain: Medium Risk (08/14/2023)    Overall Financial Resource Strain (CARDIA)     Difficulty of Paying Living Expenses: Somewhat hard   Food Insecurity: Food Insecurity Present (08/14/2023)     Hunger Vital Sign     Worried About Running Out of Food in the Last Year: Sometimes true     Ran Out of Food in the Last Year: Sometimes true   Transportation Needs: No Transportation Needs (08/14/2023)    PRAPARE - Therapist, art (Medical): No     Lack of Transportation (Non-Medical): No   Physical Activity: Insufficiently Active (08/14/2023)    Exercise Vital Sign     Days of Exercise per Week: 3 days     Minutes of Exercise per Session: 10 min   Stress: No Stress Concern Present (08/14/2023)    [employer] of Occupational Health - Occupational Stress Questionnaire     Feeling of Stress : Only a little   Social Connections: Moderately Integrated (07/22/2022)    Social Connection and Isolation Panel     Frequency of Communication with Friends and Family: More than three times a week     Frequency of Social Gatherings with Friends and Family: Patient declined     Attends Religious Services: More than 4 times per year     Active Member of [employer] or Organizations: Yes     Attends Engineer, structural: More than 4 times per year     Marital Status: Never married   Intimate Partner Violence: Not At Risk (08/14/2023)    Humiliation, Afraid, Rape, and Kick questionnaire     Fear of Current or Ex-Partner: No     Emotionally Abused: No     Physically Abused: No     Sexually Abused: No   Housing Stability: Not At Risk (08/14/2023)    Housing Stability NCSS     Do you have housing?: Yes     Are you worried about losing your housing?: No

## 2023-08-28 NOTE — Progress Notes (Signed)
 Pt saw Dr. Enzo today and requested paperwork be scanned into chart.

## 2023-11-26 ENCOUNTER — Encounter (INDEPENDENT_AMBULATORY_CARE_PROVIDER_SITE_OTHER): Payer: Self-pay | Admitting: Neurology

## 2023-11-26 ENCOUNTER — Ambulatory Visit (INDEPENDENT_AMBULATORY_CARE_PROVIDER_SITE_OTHER): Admitting: Neurology

## 2023-11-26 VITALS — BP 123/88 | HR 86 | Temp 98.2°F | Resp 16 | Ht 75.0 in | Wt 327.2 lb

## 2023-11-26 DIAGNOSIS — E1142 Type 2 diabetes mellitus with diabetic polyneuropathy: Secondary | ICD-10-CM

## 2023-11-26 DIAGNOSIS — M48062 Spinal stenosis, lumbar region with neurogenic claudication: Secondary | ICD-10-CM

## 2023-11-26 DIAGNOSIS — F319 Bipolar disorder, unspecified: Secondary | ICD-10-CM

## 2023-11-26 NOTE — Progress Notes (Signed)
 Neurology Follow-up     Date: November 26, 2023     Name:David Flowers   Date of Birth: 1985-02-08   MRN: [id number]         History     Since he was last seen things have been stable.  He is able to drive after regaining some sensation in his feet. He has more strength in his legs and is able to walk without the cane. He is scheduled for PT. He is complaint with medication and treatment.       Assessment and Plan:   1. 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 Depression  Continue paxil  Follow up in one year     Medical Hx     Past Medical History:   Diagnosis Date    Asthma     Back pain     Balance problem     Cerebrovascular accident (CMS/HCC)     Diabetes mellitus (CMS/HCC)     Hypercholesterolemia     Hypertension     Neuropathy     Numbness     Occupational therapy and vocational rehabilitation     Other physical therapy     Pain     Pins and needles sensation     Weakness         Meds     Current Outpatient Medications   Medication Instructions    Cholecalciferol  50,000 Units, Oral, weekly    divalproex (ER) extended release (DEPAKOTE ER) 500 mg, At bedtime    Farxiga 10 MG Tab 1 tablet, Every morning    ibuprofen (ADVIL) 800 mg, Every 8 hours PRN    metFORMIN (GLUCOPHAGE) 500 mg, Every morning with breakfast    metoprolol succinate XL (TOPROL-XL) 50 mg, Daily    Multiple Vitamin (multivitamin) capsule 1 capsule, Daily    PARoxetine (PAXIL) 20 mg, At bedtime    Trulicity 0.75 mg, weekly          Social Hx   Social History[1]    Allergies    Azithromycin, Pollen extract, and Erythromycin    Review of Systems   Review of Systems   Constitutional:  Negative for chills and fever.   HENT:  Negative for hearing loss.    Eyes:  Negative for blurred vision and double vision.   Respiratory:  Negative for cough  and hemoptysis.    Cardiovascular:  Negative for chest pain and palpitations.   Gastrointestinal:  Negative for heartburn and nausea.   Genitourinary:  Negative for dysuria and urgency.   Musculoskeletal:  Positive for back pain. Negative for myalgias and neck pain.   Skin:  Negative for itching and rash.   Neurological:  Positive for tingling. Negative for dizziness and headaches.   Endo/Heme/Allergies:  Negative for environmental allergies. Does not bruise/bleed easily.   Psychiatric/Behavioral:  Negative for depression and suicidal ideas.         Physical Exam:     Vitals:    11/26/23 1351   BP: 123/88   Pulse: 86   Resp: 16   Temp: 98.2 F (36.8 C)   SpO2: 97%      Physical Exam  Constitutional:       Appearance: Normal appearance. He is normal weight.   HENT:  Head: Normocephalic and atraumatic.      Nose: Nose normal.   Eyes:      Extraocular Movements: Extraocular movements intact.      Pupils: Pupils are equal, round, and reactive to light.   Musculoskeletal:      Cervical back: Normal range of motion.   Neurological:      Mental Status: He is alert and oriented to person, place, and time. Mental status is at baseline.      Cranial Nerves: No cranial nerve deficit, dysarthria or facial asymmetry.      Motor: No weakness.      Gait: Gait abnormal (antalgic).   Psychiatric:         Mood and Affect: Mood normal.         Thought Content: Thought content normal.                   [1]   Social History  Socioeconomic History    Marital status: Single   Tobacco Use    Smoking status: Never     Passive exposure: Never    Smokeless tobacco: Never   Vaping Use    Vaping status: Never Used   Substance and Sexual Activity    Alcohol use: Not Currently    Drug use: Never    Sexual activity: Never     Social Drivers of Psychologist, [employer] Services Strain: Medium Risk (08/14/2023)    Overall Financial Resource Strain (CARDIA)     Difficulty of Paying Living Expenses: Somewhat hard   Food Insecurity: Food Insecurity  Present (08/14/2023)    Hunger Vital Sign     Worried About Running Out of Food in the Last Year: Sometimes true     Ran Out of Food in the Last Year: Sometimes true   Transportation Needs: No Transportation Needs (08/14/2023)    PRAPARE - Therapist, Art (Medical): No     Lack of Transportation (Non-Medical): No   Physical Activity: Insufficiently Active (08/14/2023)    Exercise Vital Sign     Days of Exercise per Week: 3 days     Minutes of Exercise per Session: 10 min   Stress: No Stress Concern Present (08/14/2023)    [employer] of Occupational Health - Occupational Stress Questionnaire     Feeling of Stress : Only a little   Social Connections: Moderately Integrated (07/22/2022)    Social Connection and Isolation Panel     Frequency of Communication with Friends and Family: More than three times a week     Frequency of Social Gatherings with Friends and Family: Patient declined     Attends Religious Services: More than 4 times per year     Active Member of [employer] or Organizations: Yes     Attends Engineer, Structural: More than 4 times per year     Marital Status: Never married   Intimate Partner Violence: Not At Risk (08/14/2023)    Humiliation, Afraid, Rape, and Kick questionnaire     Fear of Current or Ex-Partner: No     Emotionally Abused: No     Physically Abused: No     Sexually Abused: No   Housing Stability: Not At Risk (08/14/2023)    Housing Stability NCSS     Do you have housing?: Yes     Are you worried about losing your housing?: No

## 2023-12-20 ENCOUNTER — Telehealth (INDEPENDENT_AMBULATORY_CARE_PROVIDER_SITE_OTHER): Payer: Self-pay | Admitting: Neurology

## 2023-12-20 NOTE — Telephone Encounter (Signed)
 Left msg for pt. Responding to inbasket appt request    With Provider: Sherleen CHRISTELLA Friendly, MD [facility] [place]]     Preferred Date Range: 02/23/2024 - 03/22/2024     Preferred Times: Monday Morning, Tuesday Morning, Wednesday Morning, Thursday Morning, Friday Morning    I was able to schedule a video appt with Dr. Friendly on 3/11 in the morning. I told Pt to contact us  either through mychart or phone if this appt. Does not work for him. MM 12/20/23

## 2024-01-03 ENCOUNTER — Inpatient Hospital Stay: Admitting: Physical Therapist

## 2024-01-23 ENCOUNTER — Inpatient Hospital Stay: Admitting: Physical Therapist

## 2024-01-27 ENCOUNTER — Inpatient Hospital Stay: Admitting: Physical Therapist

## 2024-01-30 ENCOUNTER — Inpatient Hospital Stay

## 2024-02-03 ENCOUNTER — Inpatient Hospital Stay: Admitting: Physical Therapist

## 2024-02-06 ENCOUNTER — Inpatient Hospital Stay

## 2024-02-10 ENCOUNTER — Inpatient Hospital Stay

## 2024-02-13 ENCOUNTER — Inpatient Hospital Stay: Admitting: Physical Therapist

## 2024-03-25 ENCOUNTER — Telehealth (INDEPENDENT_AMBULATORY_CARE_PROVIDER_SITE_OTHER): Admitting: Neurology
